# Patient Record
Sex: Female | Born: 1985 | Race: Black or African American | Hispanic: No | Marital: Single | Smoking: Former smoker
Health system: Southern US, Community
[De-identification: ages and names within clinical notes are randomized; demographics above are authoritative.]

## PROBLEM LIST (undated history)

## (undated) DIAGNOSIS — L0291 Cutaneous abscess, unspecified: Secondary | ICD-10-CM

## (undated) DIAGNOSIS — J45909 Unspecified asthma, uncomplicated: Secondary | ICD-10-CM

## (undated) DIAGNOSIS — J40 Bronchitis, not specified as acute or chronic: Secondary | ICD-10-CM

---
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
## 2007-01-22 ENCOUNTER — Emergency Department (HOSPITAL_COMMUNITY): Admission: EM | Admit: 2007-01-22 | Discharge: 2007-01-22 | Payer: Self-pay | Admitting: Emergency Medicine

## 2008-10-27 ENCOUNTER — Emergency Department (HOSPITAL_COMMUNITY): Admission: EM | Admit: 2008-10-27 | Discharge: 2008-10-27 | Payer: Self-pay | Admitting: Emergency Medicine

## 2009-03-09 ENCOUNTER — Emergency Department (HOSPITAL_COMMUNITY): Admission: EM | Admit: 2009-03-09 | Discharge: 2009-03-10 | Payer: Self-pay | Admitting: Emergency Medicine

## 2009-11-26 ENCOUNTER — Emergency Department (HOSPITAL_COMMUNITY): Admission: EM | Admit: 2009-11-26 | Discharge: 2009-11-27 | Payer: Self-pay | Admitting: Emergency Medicine

## 2009-12-23 ENCOUNTER — Emergency Department (HOSPITAL_COMMUNITY): Admission: EM | Admit: 2009-12-23 | Discharge: 2009-12-23 | Payer: Self-pay | Admitting: Emergency Medicine

## 2010-01-20 ENCOUNTER — Emergency Department (HOSPITAL_COMMUNITY): Admission: EM | Admit: 2010-01-20 | Discharge: 2010-01-20 | Payer: Self-pay | Admitting: Emergency Medicine

## 2010-03-04 ENCOUNTER — Emergency Department (HOSPITAL_COMMUNITY): Admission: EM | Admit: 2010-03-04 | Discharge: 2009-08-30 | Payer: Self-pay | Admitting: Emergency Medicine

## 2010-03-11 ENCOUNTER — Emergency Department (HOSPITAL_COMMUNITY)
Admission: EM | Admit: 2010-03-11 | Discharge: 2010-03-11 | Payer: Self-pay | Source: Home / Self Care | Admitting: Emergency Medicine

## 2010-03-28 ENCOUNTER — Emergency Department (HOSPITAL_COMMUNITY)
Admission: EM | Admit: 2010-03-28 | Discharge: 2010-03-28 | Payer: Self-pay | Source: Home / Self Care | Admitting: Emergency Medicine

## 2010-03-31 ENCOUNTER — Emergency Department (HOSPITAL_COMMUNITY)
Admission: EM | Admit: 2010-03-31 | Discharge: 2010-04-01 | Payer: Self-pay | Source: Home / Self Care | Admitting: Emergency Medicine

## 2010-04-03 ENCOUNTER — Emergency Department (HOSPITAL_COMMUNITY)
Admission: EM | Admit: 2010-04-03 | Discharge: 2010-04-03 | Payer: Self-pay | Source: Home / Self Care | Admitting: Emergency Medicine

## 2010-05-01 ENCOUNTER — Emergency Department (HOSPITAL_COMMUNITY): Payer: Self-pay

## 2010-05-01 ENCOUNTER — Emergency Department (HOSPITAL_COMMUNITY)
Admission: EM | Admit: 2010-05-01 | Discharge: 2010-05-01 | Disposition: A | Discharge status: Home / Self Care | Payer: Self-pay | Attending: Emergency Medicine | Admitting: Emergency Medicine

## 2010-05-01 DIAGNOSIS — R51 Headache: Secondary | ICD-10-CM | POA: Insufficient documentation

## 2010-05-01 DIAGNOSIS — S0990XA Unspecified injury of head, initial encounter: Secondary | ICD-10-CM | POA: Insufficient documentation

## 2010-05-01 DIAGNOSIS — S0100XA Unspecified open wound of scalp, initial encounter: Secondary | ICD-10-CM | POA: Insufficient documentation

## 2010-06-07 LAB — URINALYSIS, ROUTINE W REFLEX MICROSCOPIC
Ketones, ur: NEGATIVE mg/dL
Nitrite: NEGATIVE

## 2010-06-07 LAB — URINE MICROSCOPIC-ADD ON

## 2010-06-29 LAB — CBC
HCT: 36.8 % (ref 36.0–46.0)
Hemoglobin: 12.5 g/dL (ref 12.0–15.0)
MCHC: 33.9 g/dL (ref 30.0–36.0)
MCV: 91.5 fL (ref 78.0–100.0)
Platelets: 283 K/uL (ref 150–400)
RBC: 4.03 MIL/uL (ref 3.87–5.11)
RDW: 14.1 % (ref 11.5–15.5)
WBC: 11 K/uL — ABNORMAL HIGH (ref 4.0–10.5)

## 2010-06-29 LAB — URINALYSIS, ROUTINE W REFLEX MICROSCOPIC
Bilirubin Urine: NEGATIVE
Glucose, UA: NEGATIVE mg/dL
Ketones, ur: NEGATIVE mg/dL
Nitrite: NEGATIVE
Protein, ur: NEGATIVE mg/dL
Specific Gravity, Urine: 1.007 (ref 1.005–1.030)
Urobilinogen, UA: 1 mg/dL (ref 0.0–1.0)
pH: 6.5 (ref 5.0–8.0)

## 2010-06-29 LAB — DIFFERENTIAL
Basophils Absolute: 0 K/uL (ref 0.0–0.1)
Basophils Relative: 0 % (ref 0–1)
Eosinophils Absolute: 0 K/uL (ref 0.0–0.7)
Eosinophils Relative: 0 % (ref 0–5)
Lymphocytes Relative: 13 % (ref 12–46)
Lymphs Abs: 1.4 K/uL (ref 0.7–4.0)
Monocytes Absolute: 0.6 K/uL (ref 0.1–1.0)
Monocytes Relative: 5 % (ref 3–12)
Neutro Abs: 8.9 K/uL — ABNORMAL HIGH (ref 1.7–7.7)
Neutrophils Relative %: 81 % — ABNORMAL HIGH (ref 43–77)

## 2010-06-29 LAB — URINE MICROSCOPIC-ADD ON

## 2010-06-29 LAB — POCT PREGNANCY, URINE: Preg Test, Ur: NEGATIVE

## 2010-06-29 LAB — BASIC METABOLIC PANEL WITH GFR
BUN: 4 mg/dL — ABNORMAL LOW (ref 6–23)
CO2: 24 meq/L (ref 19–32)
Calcium: 9 mg/dL (ref 8.4–10.5)
Chloride: 101 meq/L (ref 96–112)
Creatinine, Ser: 0.74 mg/dL (ref 0.4–1.2)
GFR calc non Af Amer: >60 mL/min
Glucose, Bld: 95 mg/dL (ref 70–99)
Potassium: 3.6 meq/L (ref 3.5–5.1)
Sodium: 134 meq/L — ABNORMAL LOW (ref 135–145)

## 2010-06-29 LAB — GC/CHLAMYDIA PROBE AMP, GENITAL
Chlamydia, DNA Probe: NEGATIVE
GC Probe Amp, Genital: POSITIVE — AB

## 2010-06-29 LAB — SYPHILIS: RPR W/REFLEX TO RPR TITER AND TREPONEMAL ANTIBODIES, TRADITIONAL SCREENING AND DIAGNOSIS ALGORITHM: RPR Ser Ql: NONREACTIVE

## 2010-06-29 LAB — WET PREP, GENITAL
Trich, Wet Prep: NONE SEEN
Yeast Wet Prep HPF POC: NONE SEEN

## 2010-07-03 LAB — CBC: WBC: 10.3 10*3/uL (ref 4.0–10.5)

## 2010-07-03 LAB — TYPE AND SCREEN: Antibody Screen: NEGATIVE

## 2010-07-03 LAB — POCT PREGNANCY, URINE: Preg Test, Ur: NEGATIVE

## 2010-07-03 LAB — DIFFERENTIAL
Basophils Absolute: 0 10*3/uL (ref 0.0–0.1)
Basophils Relative: 0 % (ref 0–1)
Lymphocytes Relative: 14 % (ref 12–46)
Lymphs Abs: 1.5 10*3/uL (ref 0.7–4.0)
Monocytes Relative: 7 % (ref 3–12)
Neutro Abs: 8.1 10*3/uL — ABNORMAL HIGH (ref 1.7–7.7)
Neutrophils Relative %: 78 % — ABNORMAL HIGH (ref 43–77)

## 2010-07-03 LAB — COMPREHENSIVE METABOLIC PANEL
BUN: 5 mg/dL — ABNORMAL LOW (ref 6–23)
GFR calc Af Amer: >60 mL/min (ref >60)
Potassium: 3.9 mEq/L (ref 3.5–5.1)
Sodium: 140 mEq/L (ref 135–145)
Total Bilirubin: 0.7 mg/dL (ref 0.3–1.2)
Total Protein: 7.5 g/dL (ref 6.0–8.3)

## 2010-07-03 LAB — URINALYSIS, ROUTINE W REFLEX MICROSCOPIC
Protein, ur: 30 mg/dL — AB
Specific Gravity, Urine: 1.024 (ref 1.005–1.030)
Urobilinogen, UA: 1 mg/dL (ref 0.0–1.0)

## 2010-07-03 LAB — URINE MICROSCOPIC-ADD ON

## 2010-07-03 LAB — GC/CHLAMYDIA PROBE AMP, GENITAL
Chlamydia, DNA Probe: NEGATIVE
GC Probe Amp, Genital: POSITIVE — AB

## 2010-07-03 LAB — HCG, QUANTITATIVE, PREGNANCY: hCG, Beta Chain, Quant, S: <2 m[IU]/mL (ref <5)

## 2010-11-15 ENCOUNTER — Emergency Department (HOSPITAL_COMMUNITY)
Admission: EM | Admit: 2010-11-15 | Discharge: 2010-11-16 | Disposition: A | Discharge status: Home / Self Care | Payer: Self-pay | Attending: Emergency Medicine | Admitting: Emergency Medicine

## 2010-11-15 DIAGNOSIS — G43909 Migraine, unspecified, not intractable, without status migrainosus: Secondary | ICD-10-CM | POA: Insufficient documentation

## 2010-11-20 ENCOUNTER — Emergency Department (HOSPITAL_COMMUNITY)
Admission: EM | Admit: 2010-11-20 | Discharge: 2010-11-20 | Disposition: A | Discharge status: Home / Self Care | Payer: Self-pay | Attending: Emergency Medicine | Admitting: Emergency Medicine

## 2010-11-20 DIAGNOSIS — K089 Disorder of teeth and supporting structures, unspecified: Secondary | ICD-10-CM | POA: Insufficient documentation

## 2011-05-03 ENCOUNTER — Emergency Department (HOSPITAL_COMMUNITY): Payer: Self-pay

## 2011-05-03 ENCOUNTER — Emergency Department (HOSPITAL_COMMUNITY)
Admission: EM | Admit: 2011-05-03 | Discharge: 2011-05-03 | Disposition: A | Discharge status: Home / Self Care | Payer: Self-pay | Attending: Emergency Medicine | Admitting: Emergency Medicine

## 2011-05-03 ENCOUNTER — Encounter (HOSPITAL_COMMUNITY): Payer: Self-pay | Admitting: Emergency Medicine

## 2011-05-03 DIAGNOSIS — M549 Dorsalgia, unspecified: Secondary | ICD-10-CM | POA: Insufficient documentation

## 2011-05-03 DIAGNOSIS — J40 Bronchitis, not specified as acute or chronic: Secondary | ICD-10-CM | POA: Insufficient documentation

## 2011-05-03 DIAGNOSIS — R079 Chest pain, unspecified: Secondary | ICD-10-CM | POA: Insufficient documentation

## 2011-05-03 MED ORDER — HYDROCOD POLST-CHLORPHEN POLST 10-8 MG/5ML PO LQCR: 5.0000 mL | Freq: Two times a day (BID) | ORAL | Status: DC | PRN

## 2011-05-03 MED ORDER — MORPHINE SULFATE 2 MG/ML IJ SOLN
2.0000 mg | Freq: Once | INTRAMUSCULAR | Status: AC
  Administered 2011-05-03: 2 mg via INTRAMUSCULAR
  Filled 2011-05-03: qty 1

## 2011-05-03 NOTE — ED Notes (Signed)
Patient returned from X-ray 

## 2011-05-03 NOTE — ED Notes (Addendum)
Pt states back pain and "chest pain" (pointing to area under breasts) began yesterday. Pt states pain occurs when taking a breath in, pain is at mid back bra strap level. Pt states she has a productive cough with yellow phlegm. No nausea and vomiting.

## 2011-05-03 NOTE — ED Notes (Signed)
MD at bedside. 

## 2011-05-03 NOTE — ED Notes (Signed)
Pt states she has pain to upper chest. Feels worse when she takes a deep breath.

## 2011-05-03 NOTE — ED Provider Notes (Signed)
History     CSN: [id number]  Arrival date & time 05/03/11  1818   First MD Initiated Contact with Patient 05/03/11 2156      Chief Complaint  Patient presents with  . Back Pain  . Chest Pain    (Consider location/radiation/quality/duration/timing/severity/associated sxs/prior treatment) Patient is a 26 y.o. female presenting with back pain and chest pain. The history is provided by the patient.  Back Pain  Associated symptoms include chest pain. Pertinent negatives include no fever and no headaches.  Chest Pain Primary symptoms include cough. Pertinent negatives for primary symptoms include no fever, no shortness of breath, no palpitations, no nausea and no vomiting.    the patient is a 26 year old, female, with a history of bronchitis, and asthma, who presents to the emergency are meant complaining of right sided chest pain, and back pain.  Dr. cough.  She denies nausea, vomiting, fevers, chills, or shortness of breath.  She denies leg pain or swelling.  She does not take control pills.  She denies smoking.  She took Tylenol for her pain, but it did not work, so she came to the emergency department.    History reviewed. No pertinent past medical history.  History reviewed. No pertinent past surgical history.  Family History  Problem Relation Age of Onset  . Hypertension Mother   . Diabetes Maternal Uncle     History  Substance Use Topics  . Smoking status: Former Games developer  . Smokeless tobacco: Never Used  . Alcohol Use: Yes     occausional    OB History    Grav Para Term Preterm Abortions TAB SAB Ect Mult Living                  Review of Systems  Constitutional: Negative for fever and chills.  HENT: Negative for congestion.   Respiratory: Positive for cough. Negative for shortness of breath.   Cardiovascular: Positive for chest pain. Negative for palpitations and leg swelling.  Gastrointestinal: Negative for nausea and vomiting.  Musculoskeletal: Positive for back  pain.  Neurological: Negative for headaches.  All other systems reviewed and are negative.    Allergies  Review of patient's allergies indicates no known allergies.  Home Medications  No current outpatient prescriptions on file.  BP 118/72  Pulse 89  Temp(Src) 99 F (37.2 C) (Oral)  SpO2 99%  LMP 04/15/2011  Physical Exam  Vitals reviewed. Constitutional: She is oriented to person, place, and time. She appears well-developed and well-nourished.  HENT:  Head: Normocephalic and atraumatic.  Eyes: Pupils are equal, round, and reactive to light.  Neck: Normal range of motion.  Cardiovascular: Normal rate, regular rhythm and normal heart sounds.   No murmur heard. Pulmonary/Chest: Effort normal and breath sounds normal. No respiratory distress. She has no wheezes. She has no rales.  Abdominal: Soft. She exhibits no distension and no mass. There is no tenderness. There is no rebound and no guarding.  Musculoskeletal: Normal range of motion. She exhibits no edema and no tenderness.  Neurological: She is alert and oriented to person, place, and time. No cranial nerve deficit.  Skin: Skin is warm and dry. No rash noted. No erythema.  Psychiatric: She has a normal mood and affect. Her behavior is normal.    ED Course  Procedures (including critical care time)  Labs Reviewed - No data to display No results found.   No diagnosis found.    MDM  Bronchitis No pneumonia no hypoxia or respiratory distress  Nicholes Stairs, MD 05/03/11 [phone]

## 2012-09-22 ENCOUNTER — Emergency Department (HOSPITAL_COMMUNITY): Payer: Self-pay

## 2012-09-22 ENCOUNTER — Encounter (HOSPITAL_COMMUNITY): Payer: Self-pay

## 2012-09-22 ENCOUNTER — Emergency Department (HOSPITAL_COMMUNITY)
Admission: EM | Admit: 2012-09-22 | Discharge: 2012-09-22 | Disposition: A | Discharge status: Home / Self Care | Payer: Self-pay | Attending: Emergency Medicine | Admitting: Emergency Medicine

## 2012-09-22 DIAGNOSIS — Z87891 Personal history of nicotine dependence: Secondary | ICD-10-CM | POA: Insufficient documentation

## 2012-09-22 DIAGNOSIS — M549 Dorsalgia, unspecified: Secondary | ICD-10-CM | POA: Insufficient documentation

## 2012-09-22 DIAGNOSIS — R071 Chest pain on breathing: Secondary | ICD-10-CM | POA: Insufficient documentation

## 2012-09-22 DIAGNOSIS — R0789 Other chest pain: Secondary | ICD-10-CM

## 2012-09-22 LAB — POCT I-STAT, CHEM 8
BUN: 6 mg/dL (ref 6–23)
Calcium, Ion: 1.22 mmol/L (ref 1.12–1.23)
Chloride: 102 mEq/L (ref 96–112)
Creatinine, Ser: 0.8 mg/dL (ref 0.50–1.10)
Glucose, Bld: 95 mg/dL (ref 70–99)
Potassium: 4.1 mEq/L (ref 3.5–5.1)

## 2012-09-22 MED ORDER — OXYCODONE-ACETAMINOPHEN 5-325 MG PO TABS: ORAL_TABLET | ORAL | Status: DC

## 2012-09-22 MED ORDER — MORPHINE SULFATE 4 MG/ML IJ SOLN
4.0000 mg | Freq: Once | INTRAMUSCULAR | Status: AC
  Administered 2012-09-22: 4 mg via INTRAMUSCULAR
  Filled 2012-09-22: qty 1

## 2012-09-22 MED ORDER — CYCLOBENZAPRINE HCL 10 MG PO TABS: 10.0000 mg | ORAL_TABLET | Freq: Two times a day (BID) | ORAL | Status: DC | PRN

## 2012-09-22 NOTE — ED Provider Notes (Signed)
History    CSN: [id number] Arrival date & time 09/22/12  4098  First MD Initiated Contact with Patient 09/22/12 0510     Chief Complaint  Patient presents with  . Chest Pain  . Back Pain   (Consider location/radiation/quality/duration/timing/severity/associated sxs/prior Treatment) HPI  Dana Morris is a 27 y.o. female complaining of 2 days of constant, diffuse generalized chest pain that radiates to the back pain is described as sharp, pressure like and cramping it is rated at 10 out of 10. It is exacerbated by eating. She denies trauma, cough, fever, shortness of breath, nausea vomiting, change in bowel or bladder habits, recent immobilization, leg swelling or pain, and history of early cardiac death. Patient is taken ibuprofen, acetaminophen and Percocet with no relief the  History reviewed. No pertinent past medical history. History reviewed. No pertinent past surgical history. Family History  Problem Relation Age of Onset  . Hypertension Mother   . Diabetes Maternal Uncle    History  Substance Use Topics  . Smoking status: Former Games developer  . Smokeless tobacco: Never Used  . Alcohol Use: Yes     Comment: occasional    OB History   Grav Para Term Preterm Abortions TAB SAB Ect Mult Living                 Review of Systems  Constitutional:       Negative except as described in HPI  HENT:       Negative except as described in HPI  Respiratory:       Negative except as described in HPI  Cardiovascular:       Negative except as described in HPI  Gastrointestinal:       Negative except as described in HPI  Genitourinary:       Negative except as described in HPI  Musculoskeletal:       Negative except as described in HPI  Skin:       Negative except as described in HPI  Neurological:       Negative except as described in HPI  All other systems reviewed and are negative.    Allergies  Review of patient's allergies indicates no known allergies.  Home  Medications   Current Outpatient Rx  Name  Route  Sig  Dispense  Refill  . chlorpheniramine-HYDROcodone (TUSSIONEX PENNKINETIC ER) 10-8 MG/5ML LQCR   Oral   Take 5 mLs by mouth every 12 (twelve) hours as needed.   140 mL   0    BP 113/73  Pulse 80  Temp(Src) 98.4 F (36.9 C) (Oral)  Resp 18  Ht 5\' 7"  (1.702 m)  SpO2 99%  LMP 09/13/2012 Physical Exam  Nursing note and vitals reviewed. Constitutional: She is oriented to person, place, and time. She appears well-developed and well-nourished. No distress.  HENT:  Head: Normocephalic.  Mouth/Throat: Oropharynx is clear and moist.  Eyes: Conjunctivae and EOM are normal. Pupils are equal, round, and reactive to light.  Neck: Normal range of motion.  Cardiovascular: Normal rate, regular rhythm, normal heart sounds and intact distal pulses.   Pulmonary/Chest: Effort normal. No stridor. No respiratory distress. She has no wheezes. She has no rales. She exhibits tenderness.  Diffusely tender to palpation of the anterior chest, no crepitus  Abdominal: Soft. Bowel sounds are normal. She exhibits no distension and no mass. There is no tenderness. There is no rebound.  Musculoskeletal: Normal range of motion.  No calf asymmetry, superficial collaterals, palpable cords, edema,  Homans sign negative bilaterally.    Neurological: She is alert and oriented to person, place, and time.  Psychiatric: She has a normal mood and affect.    ED Course  Procedures (including critical care time) Labs Reviewed - No data to display No results found.   Date: 09/22/2012  Rate: 88  Rhythm: normal sinus rhythm  QRS Axis: normal borderline right axis deviation is seen on prior EKG  Intervals: normal  ST/T Wave abnormalities: normal  Conduction Disutrbances:none  Narrative Interpretation:   Old EKG Reviewed: unchanged    1. Chest wall pain     MDM   Filed Vitals:   09/22/12 0438  BP: 113/73  Pulse: 80  Temp: 98.4 F (36.9 C)  TempSrc:  Oral  Resp: 18  Height: 5\' 7"  (1.702 m)  SpO2: 99%     Dana Morris is a 27 y.o. female of constant chest pain for the last 2 days. Patient is low risk by Wells criteria and PERC negative. EKG is nonischemic, troponin is negative, patient is not anemic. We'll treat her for a costochondritis with high-dose NSAID's.  Medications  morphine 4 MG/ML injection 4 mg (4 mg Intramuscular Given 09/22/12 0547)    Pt is hemodynamically stable, appropriate for, and amenable to discharge at this time. Pt verbalized understanding and agrees with care plan. Outpatient follow-up and specific return precautions discussed.    New Prescriptions   CYCLOBENZAPRINE (FLEXERIL) 10 MG TABLET    Take 1 tablet (10 mg total) by mouth 2 (two) times daily as needed for muscle spasms.     Wynetta Emery, PA-C 09/22/12 [phone]

## 2012-09-22 NOTE — ED Notes (Signed)
Pt presents with generalized chest pain that began 2 days ago. Pt says the pain radiates to the center of her back. Pt says the pain is made worse when she "swallows food and tries to digest it". Pt denies shortness of breath and dizziness.

## 2012-09-22 NOTE — ED Provider Notes (Signed)
Medical screening examination/treatment/procedure(s) were performed by non-physician practitioner and as supervising physician I was immediately available for consultation/collaboration.  Javeah Loeza M Lekeshia Kram, MD 09/22/12 0733 

## 2014-08-02 ENCOUNTER — Emergency Department (INDEPENDENT_AMBULATORY_CARE_PROVIDER_SITE_OTHER)
Admission: EM | Admit: 2014-08-02 | Discharge: 2014-08-02 | Disposition: A | Discharge status: Home / Self Care | Payer: Self-pay | Source: Home / Self Care | Attending: Family Medicine | Admitting: Family Medicine

## 2014-08-02 ENCOUNTER — Encounter (HOSPITAL_COMMUNITY): Payer: Self-pay | Admitting: Emergency Medicine

## 2014-08-02 DIAGNOSIS — K0889 Other specified disorders of teeth and supporting structures: Secondary | ICD-10-CM

## 2014-08-02 DIAGNOSIS — K088 Other specified disorders of teeth and supporting structures: Secondary | ICD-10-CM

## 2014-08-02 MED ORDER — IBUPROFEN 800 MG PO TABS: 800.0000 mg | ORAL_TABLET | Freq: Three times a day (TID) | ORAL | Status: DC

## 2014-08-02 MED ORDER — KETOROLAC TROMETHAMINE 60 MG/2ML IM SOLN
60.0000 mg | Freq: Once | INTRAMUSCULAR | Status: AC
  Administered 2014-08-02: 60 mg via INTRAMUSCULAR

## 2014-08-02 MED ORDER — KETOROLAC TROMETHAMINE 60 MG/2ML IM SOLN
INTRAMUSCULAR | Status: AC
  Filled 2014-08-02: qty 2

## 2014-08-02 NOTE — ED Notes (Signed)
C/o dental pain; right side x 3 days Denies feves, chills Alert, no signs of acute distress.

## 2014-08-02 NOTE — Discharge Instructions (Signed)

## 2014-08-02 NOTE — ED Provider Notes (Signed)
CSN: [id number]     Arrival date & time 08/02/14  1435 History   None    Chief Complaint  Patient presents with  . Dental Pain   (Consider location/radiation/quality/duration/timing/severity/associated sxs/prior Treatment) Patient is a 29 y.o. female presenting with tooth pain. The history is provided by the patient.  Dental Pain Location:  Lower Quality:  Constant and sharp Severity:  Mild Onset quality:  Gradual Duration:  3 days Timing:  Constant Progression:  Worsening Chronicity:  New Relieved by:  Acetaminophen Ineffective treatments:  Acetaminophen Associated symptoms: no drooling, no facial swelling, no fever, no headaches, no neck pain and no oral lesions   Risk factors: lack of dental care and smoking     History reviewed. No pertinent past medical history. History reviewed. No pertinent past surgical history. Family History  Problem Relation Age of Onset  . Hypertension Mother   . Diabetes Maternal Uncle    History  Substance Use Topics  . Smoking status: Former Games developermoker  . Smokeless tobacco: Never Used  . Alcohol Use: Yes     Comment: occasional    OB History    No data available     Review of Systems  Constitutional: Negative for fever.  HENT: Positive for dental problem. Negative for drooling, ear pain, facial swelling, hearing loss, mouth sores, rhinorrhea, sinus pressure, sore throat and trouble swallowing.   Respiratory: Negative for shortness of breath.   Musculoskeletal: Negative for neck pain.  Neurological: Negative for dizziness, weakness, numbness and headaches.    Allergies  Review of patient's allergies indicates no known allergies.  Home Medications   Prior to Admission medications   Medication Sig Start Date End Date Taking? Authorizing Provider  acetaminophen (TYLENOL) 500 MG tablet Take 500 mg by mouth every 6 (six) hours as needed for pain.    Historical Provider, MD  ibuprofen (ADVIL,MOTRIN) 800 MG tablet Take 1 tablet (800 mg  total) by mouth 3 (three) times daily. 08/02/14   Ofilia NeasMichael L Clark, PA-C   BP 117/74 mmHg  Pulse 103  Temp(Src) 98 F (36.7 C) (Oral)  Resp 16  SpO2 97%  LMP 07/28/2014 Physical Exam  Constitutional: She is oriented to person, place, and time. She appears well-developed and well-nourished.  HENT:  Mouth/Throat: Uvula is midline, oropharynx is clear and moist and mucous membranes are normal. No oral lesions. No trismus in the jaw. Dental caries present. No dental abscesses, uvula swelling or lacerations.    Cardiovascular: Normal rate.   Checked by me at 90.  Pulmonary/Chest: Effort normal and breath sounds normal.  Neurological: She is alert and oriented to person, place, and time.  Skin: Skin is warm and dry.  Psychiatric: She has a normal mood and affect.  Nursing note and vitals reviewed.   ED Course  Procedures (including critical care time) Labs Review Labs Reviewed - No data to display  Imaging Review No results found.   MDM   1. Tooth pain    Patient with tooth pain on the left most posterior bottom molar with visible damage to the tooth.  The remainder of her dental exam is reassuring.  Will treat with toradol 60 im now.  Advised 800 Ibuprofen in 8 hours from now, and q8 thereafter.  Advised via AVS that she see a dentist as soon as possible.     Ofilia NeasMichael L Clark, PA-C 08/02/14 1557

## 2015-12-19 DIAGNOSIS — Y939 Activity, unspecified: Secondary | ICD-10-CM | POA: Insufficient documentation

## 2015-12-19 DIAGNOSIS — Z87891 Personal history of nicotine dependence: Secondary | ICD-10-CM | POA: Insufficient documentation

## 2015-12-19 DIAGNOSIS — Y92512 Supermarket, store or market as the place of occurrence of the external cause: Secondary | ICD-10-CM | POA: Insufficient documentation

## 2015-12-19 DIAGNOSIS — X501XXA Overexertion from prolonged static or awkward postures, initial encounter: Secondary | ICD-10-CM | POA: Insufficient documentation

## 2015-12-19 DIAGNOSIS — Y999 Unspecified external cause status: Secondary | ICD-10-CM | POA: Insufficient documentation

## 2015-12-19 DIAGNOSIS — S8265XA Nondisplaced fracture of lateral malleolus of left fibula, initial encounter for closed fracture: Secondary | ICD-10-CM | POA: Insufficient documentation

## 2015-12-19 NOTE — ED Triage Notes (Signed)
Pt reports she twisted the left ankle today. C/o pain and swelling in the ankle. Last took tylenol without relief at 1700.

## 2015-12-20 ENCOUNTER — Emergency Department (HOSPITAL_COMMUNITY): Payer: Self-pay

## 2015-12-20 ENCOUNTER — Emergency Department (HOSPITAL_COMMUNITY)
Admission: EM | Admit: 2015-12-20 | Discharge: 2015-12-20 | Disposition: A | Discharge status: Home / Self Care | Payer: Self-pay | Attending: Emergency Medicine | Admitting: Emergency Medicine

## 2015-12-20 ENCOUNTER — Encounter (HOSPITAL_COMMUNITY): Payer: Self-pay | Admitting: *Deleted

## 2015-12-20 DIAGNOSIS — S82892A Other fracture of left lower leg, initial encounter for closed fracture: Secondary | ICD-10-CM

## 2015-12-20 MED ORDER — IBUPROFEN 400 MG PO TABS
800.0000 mg | ORAL_TABLET | Freq: Once | ORAL | Status: AC
  Administered 2015-12-20: 800 mg via ORAL
  Filled 2015-12-20: qty 2

## 2015-12-20 MED ORDER — HYDROCODONE-ACETAMINOPHEN 5-325 MG PO TABS: 1.0000 | ORAL_TABLET | Freq: Four times a day (QID) | ORAL | 0 refills | Status: DC | PRN

## 2015-12-20 NOTE — ED Provider Notes (Signed)
[facility]-EMERGENCY DEPT Provider Note   CSN: [id number] Arrival date & time: 12/19/15  2337     History   Chief Complaint Chief Complaint  Patient presents with  . Ankle Pain    HPI Dana Morris is a 30 y.o. female.  Patient presents to the emergency department with chief complaint of left ankle pain. She states that she rolled her left ankle of the grocery store today. She has tried taking Tylenol with no relief. She reports moderate associated swelling. The pain is worsened with palpation and movement. It is also worsened with ambulation. There are no other associated symptoms. Pain does not radiate. The pain is constant.   The history is provided by the patient. No language interpreter was used.    History reviewed. No pertinent past medical history.  There are no active problems to display for this patient.   History reviewed. No pertinent surgical history.  OB History    No data available       Home Medications    Prior to Admission medications   Medication Sig Start Date End Date Taking? Authorizing Provider  acetaminophen (TYLENOL) 500 MG tablet Take 500 mg by mouth every 6 (six) hours as needed for pain.    Historical Provider, MD  ibuprofen (ADVIL,MOTRIN) 800 MG tablet Take 1 tablet (800 mg total) by mouth 3 (three) times daily. 08/02/14   Ofilia NeasMichael L Clark, PA-C    Family History Family History  Problem Relation Age of Onset  . Hypertension Mother   . Diabetes Maternal Uncle     Social History Social History  Substance Use Topics  . Smoking status: Former Games developermoker  . Smokeless tobacco: Never Used  . Alcohol use Yes     Comment: occasional      Allergies   Review of patient's allergies indicates no known allergies.   Review of Systems Review of Systems  Constitutional: Negative for chills and fever.  Respiratory: Negative for shortness of breath.   Cardiovascular: Negative for chest pain.  Gastrointestinal: Negative for constipation,  diarrhea, nausea and vomiting.  Genitourinary: Negative for dysuria.  Musculoskeletal: Positive for arthralgias, joint swelling and myalgias.     Physical Exam Updated Vital Signs BP 117/84   Pulse 93   Temp 98.4 F (36.9 C) (Oral)   Resp 16   Ht 5\' 7"  (1.702 m)   Wt 47.2 kg   LMP 12/06/2015 (Approximate)   SpO2 98%   BMI 16.29 kg/m   Physical Exam Physical Exam  Constitutional: Pt appears well-developed and well-nourished. No distress.  HENT:  Head: Normocephalic and atraumatic.  Eyes: Conjunctivae are normal.  Neck: Normal range of motion.  Cardiovascular: Normal rate, regular rhythm and intact distal pulses.   Capillary refill < 3 sec  Pulmonary/Chest: Effort normal and breath sounds normal.  Musculoskeletal: Left Ankle: Pt exhibits tenderness to palpation of the left ankle over the ATFL. Pt exhibits no edema.  ROM: 4/5 limited by pain  Neurological: Pt  is alert. Coordination normal.  Sensation 5/5 Strength 4/5 limited by pain  Skin: Skin is warm and dry. Pt is not diaphoretic.  No tenting of the skin  Psychiatric: Pt has a normal mood and affect.  Nursing note and vitals reviewed.   ED Treatments / Results  Labs (all labs ordered are listed, but only abnormal results are displayed) Labs Reviewed - No data to display  EKG  EKG Interpretation None       Radiology Dg Ankle Complete Left  Result Date:  12/20/2015 CLINICAL DATA:  Twisted ankle yesterday while exiting store. EXAM: LEFT ANKLE COMPLETE - 3+ VIEW COMPARISON:  None. FINDINGS: Tiny lateral malleolus tip avulsion fracture in alignment. Ankle mortise appears congruent and lateral clear space intact. No destructive bony lesions. Antral lateral ankle soft tissue swelling without subcutaneous gas or radiopaque foreign bodies. IMPRESSION: Nondisplaced acute lateral malleolus avulsion fracture. No dislocation. Electronically Signed   By: Awilda Metro M.D.   On: 12/20/2015 01:35     Procedures Procedures (including critical care time)  Medications Ordered in ED Medications  ibuprofen (ADVIL,MOTRIN) tablet 800 mg (800 mg Oral Given 12/20/15 0041)     Initial Impression / Assessment and Plan / ED Course  I have reviewed the triage vital signs and the nursing notes.  Pertinent labs & imaging results that were available during my care of the patient were reviewed by me and considered in my medical decision making (see chart for details).  Clinical Course    X-ray shows mild nondisplaced avulsion fracture.  Brace and crutches.   Pt advised to follow up with orthopedics. Patient given brace while in ED, conservative therapy recommended and discussed. Patient will be discharged home & is agreeable with above plan. Returns precautions discussed. Pt appears safe for discharge.   Final Clinical Impressions(s) / ED Diagnoses   Final diagnoses:  Avulsion fracture of ankle, left, closed, initial encounter    New Prescriptions New Prescriptions   HYDROCODONE-ACETAMINOPHEN (NORCO/VICODIN) 5-325 MG TABLET    Take 1-2 tablets by mouth every 6 (six) hours as needed.     Roxy Horseman, PA-C 12/20/15 0145    Pricilla Loveless, MD 12/28/15 0900

## 2015-12-20 NOTE — ED Notes (Signed)
Pt transported to xray 

## 2015-12-20 NOTE — ED Notes (Signed)
PA-C at bedside 

## 2015-12-20 NOTE — ED Notes (Signed)
Patient verbalized understanding of discharge instructions and denies any further needs or questions at this time. VS stable. Patient ambulatory with steady gait. RN escorted to ED entrance.   

## 2016-07-22 ENCOUNTER — Emergency Department (HOSPITAL_COMMUNITY)
Admission: EM | Admit: 2016-07-22 | Discharge: 2016-07-22 | Disposition: A | Discharge status: Home / Self Care | Payer: Self-pay | Attending: Emergency Medicine | Admitting: Emergency Medicine

## 2016-07-22 ENCOUNTER — Encounter (HOSPITAL_COMMUNITY): Payer: Self-pay | Admitting: Emergency Medicine

## 2016-07-22 DIAGNOSIS — Z87891 Personal history of nicotine dependence: Secondary | ICD-10-CM | POA: Insufficient documentation

## 2016-07-22 DIAGNOSIS — J40 Bronchitis, not specified as acute or chronic: Secondary | ICD-10-CM | POA: Insufficient documentation

## 2016-07-22 HISTORY — DX: Unspecified asthma, uncomplicated: J45.909

## 2016-07-22 MED ORDER — PREDNISONE 50 MG PO TABS: ORAL_TABLET | ORAL | 0 refills | Status: DC

## 2016-07-22 MED ORDER — IPRATROPIUM-ALBUTEROL 0.5-2.5 (3) MG/3ML IN SOLN
3.0000 mL | Freq: Once | RESPIRATORY_TRACT | Status: AC
  Administered 2016-07-22: 3 mL via RESPIRATORY_TRACT
  Filled 2016-07-22: qty 3

## 2016-07-22 MED ORDER — BENZONATATE 200 MG PO CAPS: 200.0000 mg | ORAL_CAPSULE | Freq: Three times a day (TID) | ORAL | 0 refills | Status: DC | PRN

## 2016-07-22 MED ORDER — ALBUTEROL SULFATE HFA 108 (90 BASE) MCG/ACT IN AERS
2.0000 | INHALATION_SPRAY | RESPIRATORY_TRACT | Status: DC | PRN
  Filled 2016-07-22: qty 6.7

## 2016-07-22 NOTE — ED Triage Notes (Signed)
Pt reports productive cough since yesterday, hx of recurrent bronchitis, denies fevers. resp e/u, nad.

## 2016-07-22 NOTE — ED Provider Notes (Signed)
[facility]-EMERGENCY DEPT Provider Note   CSN: [id number] Arrival date & time: 07/22/16  1537  By signing my name below, I, Doreatha Martin, attest that this documentation has been prepared under the direction and in the presence of  [facility] M. Damian Leavell, NP. Electronically Signed: Doreatha Martin, ED Scribe. 07/22/16. 4:07 PM.    History   Chief Complaint Chief Complaint  Patient presents with  . Cough    HPI Dana Morris is a 31 y.o. female who presents to the Emergency Department complaining of worsening productive cough with clear phlegm x 2 days with associated wheezing, chest tightness, nasal congestion. She reports h/o asthma and bronchitis, for which she uses an inhaler, but states she recently ran out. No worsening or alleviating factors noted. She reports recent sick contact with children who have a cough. Pt is a non-smoker. She denies nausea, vomiting, diarrhea, abdominal pain, frequency, dysuria, HA, sore throat, ear pain, sinus pain or tenderness, fever.    The history is provided by the patient. No language interpreter was used.  Cough  This is a new problem. The current episode started 2 days ago. The problem occurs every few minutes. The problem has been gradually worsening. The cough is productive of sputum. There has been no fever. Associated symptoms include wheezing. Pertinent negatives include no ear pain, no headaches and no sore throat. She has tried nothing for the symptoms. The treatment provided no relief. She is not a smoker. Her past medical history is significant for bronchitis and asthma.    Past Medical History:  Diagnosis Date  . Asthma     There are no active problems to display for this patient.   History reviewed. No pertinent surgical history.  OB History    No data available       Home Medications    Prior to Admission medications   Medication Sig Start Date End Date Taking? Authorizing Provider  acetaminophen (TYLENOL) 500 MG tablet Take 500 mg by  mouth every 6 (six) hours as needed for pain.    Historical Provider, MD  benzonatate (TESSALON) 200 MG capsule Take 1 capsule (200 mg total) by mouth 3 (three) times daily as needed for cough. 07/22/16   Hope Orlene Och, NP  HYDROcodone-acetaminophen (NORCO/VICODIN) 5-325 MG tablet Take 1-2 tablets by mouth every 6 (six) hours as needed. 12/20/15   Roxy Horseman, PA-C  ibuprofen (ADVIL,MOTRIN) 800 MG tablet Take 1 tablet (800 mg total) by mouth 3 (three) times daily. 08/02/14   Ofilia Neas, PA-C  predniSONE (DELTASONE) 50 MG tablet Take one tablet PO daily 07/22/16   Janne Napoleon, NP    Family History Family History  Problem Relation Age of Onset  . Hypertension Mother   . Diabetes Maternal Uncle     Social History Social History  Substance Use Topics  . Smoking status: Former Games developer  . Smokeless tobacco: Never Used  . Alcohol use Yes     Comment: occasional      Allergies   Patient has no known allergies.   Review of Systems Review of Systems  Constitutional: Negative for fever.  HENT: Positive for congestion. Negative for ear pain, sinus pain, sinus pressure and sore throat.   Respiratory: Positive for cough, chest tightness and wheezing.   Gastrointestinal: Negative for abdominal pain, diarrhea, nausea and vomiting.  Genitourinary: Negative for dysuria.  Neurological: Negative for headaches.     Physical Exam Updated Vital Signs BP 118/84   Pulse 67   Temp 98.8  F (37.1 C) (Oral)   Resp 16   Ht  (1.702 m)   Wt 88 kg   LMP 07/02/2016 (Exact Date)   SpO2 97%   BMI 30.38 kg/m   Physical Exam  Constitutional: She appears well-developed and well-nourished.  HENT:  Head: Normocephalic and atraumatic.  Right Ear: Tympanic membrane normal.  Left Ear: Tympanic membrane normal.  Nose: Right sinus exhibits no maxillary sinus tenderness and no frontal sinus tenderness. Left sinus exhibits no maxillary sinus tenderness and no frontal sinus tenderness.    Mouth/Throat: Uvula is midline, oropharynx is clear and moist and mucous membranes are normal. No posterior oropharyngeal edema or posterior oropharyngeal erythema.  Eyes: EOM are normal. Pupils are equal, round, and reactive to light. No scleral icterus.  Good ocular movement. Sclera clear  Cardiovascular: Normal rate and regular rhythm.   Pulmonary/Chest: Effort normal. No respiratory distress. She has wheezes. She has no rales.  Occasional wheeze on the left.   Abdominal: Soft. There is no tenderness.  No CVA tenderness.   Musculoskeletal: Normal range of motion.  Lymphadenopathy:    She has no cervical adenopathy.  Skin: Skin is warm.  Psychiatric: She has a normal mood and affect. Her behavior is normal.  Nursing note and vitals reviewed.    ED Treatments / Results  Duoneb  DIAGNOSTIC STUDIES: Oxygen Saturation is 97% on RA, normal by my interpretation.    COORDINATION OF CARE: 4:04 PM Discussed treatment plan with pt at bedside which includes breathing tx and pt agreed to plan.    Procedures Procedures (including critical care time)  Medications Ordered in ED Medications  albuterol (PROVENTIL HFA;VENTOLIN HFA) 108 (90 Base) MCG/ACT inhaler 2 puff (not administered)  ipratropium-albuterol (DUONEB) 0.5-2.5 (3) MG/3ML nebulizer solution 3 mL (3 mLs Nebulization Given 07/22/16 1614)     Initial Impression / Assessment and Plan / ED Course  I have reviewed the triage vital signs and the nursing notes.   Clinical Course as of Jul 22 1736  Fri Jul 22, 2016  1631 Pt reevaluated. Lungs are clear and pt reports feeling much better. Plan to send home with an inhaler, Tessalon perles and a short course of steroids.   [EM]    Clinical Course User Index [EM] Doreatha Martin    Garen Grams presents to the ED for evaluation of cough x 2 days. Symptoms consistent with bronchitis. Patient will be sent home with inhaler, Tessalon perles and a short course of steroids.  Conservative therapies discussed and recommended. Patient advised to follow up with PCP as needed, or with worsening symptoms. Patient appears stable for discharge at this time. Return precautions discussed and outlined in discharge paperwork. Patient is agreeable to plan.     Final Clinical Impressions(s) / ED Diagnoses   Final diagnoses:  Bronchitis    New Prescriptions Discharge Medication List as of 07/22/2016  4:33 PM    START taking these medications   Details  benzonatate (TESSALON) 200 MG capsule Take 1 capsule (200 mg total) by mouth 3 (three) times daily as needed for cough., Starting Fri 07/22/2016, Print    predniSONE (DELTASONE) 50 MG tablet Take one tablet PO daily, Print        I personally performed the services described in this documentation, which was scribed in my presence. The recorded information has been reviewed and is accurate.    [place], [place] 07/22/16 1741    Derwood Kaplan, MD 07/23/16 1233

## 2017-01-12 ENCOUNTER — Emergency Department (HOSPITAL_COMMUNITY): Payer: Self-pay

## 2017-01-12 ENCOUNTER — Emergency Department (HOSPITAL_COMMUNITY)
Admission: EM | Admit: 2017-01-12 | Discharge: 2017-01-12 | Disposition: A | Discharge status: Home / Self Care | Payer: Self-pay | Attending: Emergency Medicine | Admitting: Emergency Medicine

## 2017-01-12 DIAGNOSIS — Z79899 Other long term (current) drug therapy: Secondary | ICD-10-CM | POA: Insufficient documentation

## 2017-01-12 DIAGNOSIS — J45909 Unspecified asthma, uncomplicated: Secondary | ICD-10-CM | POA: Insufficient documentation

## 2017-01-12 DIAGNOSIS — Z87891 Personal history of nicotine dependence: Secondary | ICD-10-CM | POA: Insufficient documentation

## 2017-01-12 DIAGNOSIS — M940 Chondrocostal junction syndrome [Tietze]: Secondary | ICD-10-CM | POA: Insufficient documentation

## 2017-01-12 LAB — CBC
HCT: 41.7 % (ref 36.0–46.0)
HEMOGLOBIN: 14.3 g/dL (ref 12.0–15.0)
MCH: 30.7 pg (ref 26.0–34.0)
MCHC: 34.3 g/dL (ref 30.0–36.0)
MCV: 89.5 fL (ref 78.0–100.0)
Platelets: 264 10*3/uL (ref 150–400)
RBC: 4.66 MIL/uL (ref 3.87–5.11)
RDW: 13.4 % (ref 11.5–15.5)
WBC: 6.7 10*3/uL (ref 4.0–10.5)

## 2017-01-12 LAB — BASIC METABOLIC PANEL
Anion gap: 8 (ref 5–15)
BUN: 6 mg/dL (ref 6–20)
CALCIUM: 9.2 mg/dL (ref 8.9–10.3)
CO2: 22 mmol/L (ref 22–32)
CREATININE: 0.78 mg/dL (ref 0.44–1.00)
Chloride: 104 mmol/L (ref 101–111)
GFR calc Af Amer: >60 mL/min (ref >60)
GFR calc non Af Amer: >60 mL/min (ref >60)
GLUCOSE: 89 mg/dL (ref 65–99)
Potassium: 4 mmol/L (ref 3.5–5.1)
Sodium: 134 mmol/L — ABNORMAL LOW (ref 135–145)

## 2017-01-12 LAB — I-STAT TROPONIN, ED: TROPONIN I, POC: 0 ng/mL (ref 0.00–0.08)

## 2017-01-12 MED ORDER — IBUPROFEN 400 MG PO TABS
400.0000 mg | ORAL_TABLET | Freq: Once | ORAL | Status: AC
  Administered 2017-01-12: 400 mg via ORAL
  Filled 2017-01-12: qty 1

## 2017-01-12 NOTE — ED Triage Notes (Signed)
Pt to ed for evaluation of progressive left sided chest pain x2 days that radiates to back with associated symptom of left arm numbness. Denies cough, N/V, lightheadedness, dizziness. Pt endorses mild SOB with worsening pain. NAD at this time. Pt took tylenol/ibuprofen with mild relief of symptoms.

## 2017-01-12 NOTE — ED Provider Notes (Signed)
[facility] Provider Note   CSN: [id number] Arrival date & time: 01/12/17  1535     History   Chief Complaint Chief Complaint  Patient presents with  . Chest Pain     HPI   Blood pressure 125/82, pulse 69, temperature 97.9 F (36.6 C), temperature source Oral, resp. rate 16, height 5\' 7"  (1.702 m), weight 92.1 kg (203 lb), last menstrual period 12/20/2016, SpO2 100 %.  Garen GramsLashina S Zimmermann is a 31 y.o. female complaining of sharp left-sided chest pain exacerbated by movement, palpation and deep breathing onset 2 days ago, not alleviated with Tylenol or aspirin. She denies fevers, chills, cough, shortness of breath, palpitations (she said sometimes she feels like her heart is fluttering), history of DVT/PE, recent immobilization, calf pain, leg swelling, family history of early cardiac death, cocaine or methamphetamine use.  Past Medical History:  Diagnosis Date  . Asthma     There are no active problems to display for this patient.   No past surgical history on file.  OB History    No data available       Home Medications    Prior to Admission medications   Medication Sig Start Date End Date Taking? Authorizing Provider  acetaminophen (TYLENOL) 500 MG tablet Take 500 mg by mouth every 6 (six) hours as needed for pain.   Yes [provider]  ibuprofen (ADVIL,MOTRIN) 800 MG tablet Take 1 tablet (800 mg total) by mouth 3 (three) times daily. Patient taking differently: Take 400 mg by mouth every 6 (six) hours as needed.  08/02/14  Yes Ofilia Neaslark, Michael L, PA-C  benzonatate (TESSALON) 200 MG capsule Take 1 capsule (200 mg total) by mouth 3 (three) times daily as needed for cough. Patient not taking: Reported on 01/12/2017 07/22/16   Janne NapoleonNeese, Hope M, NP  HYDROcodone-acetaminophen (NORCO/VICODIN) 5-325 MG tablet Take 1-2 tablets by mouth every 6 (six) hours as needed. Patient not taking: Reported on 01/12/2017 12/20/15   Roxy HorsemanBrowning,  Robert, PA-C  predniSONE (DELTASONE) 50 MG tablet Take one tablet PO daily Patient not taking: Reported on 01/12/2017 07/22/16   Janne NapoleonNeese, Hope M, NP    Family History Family History  Problem Relation Age of Onset  . Hypertension Mother   . Diabetes Maternal Uncle     Social History Social History  Substance Use Topics  . Smoking status: Former Games developermoker  . Smokeless tobacco: Never Used  . Alcohol use Yes     Comment: occasional      Allergies   Patient has no known allergies.   Review of Systems Review of Systems  A complete review of systems was obtained and all systems are negative except as noted in the HPI and PMH.    Physical Exam Updated Vital Signs BP 125/82 (BP Location: Right Arm)   Pulse 69   Temp 97.9 F (36.6 C) (Oral)   Resp 16   Ht 5\' 7"  (1.702 m)   Wt 92.1 kg (203 lb)   LMP 12/20/2016   SpO2 100%   BMI 31.79 kg/m   Physical Exam  Constitutional: She is oriented to person, place, and time. She appears well-developed and well-nourished. No distress.  HENT:  Head: Normocephalic and atraumatic.  Mouth/Throat: Oropharynx is clear and moist.  Eyes: Pupils are equal, round, and reactive to light. Conjunctivae and EOM are normal.  Neck: Normal range of motion. No JVD present. No tracheal deviation present.  Cardiovascular: Normal rate, regular rhythm and intact distal pulses.  Radial pulse equal bilaterally  Pulmonary/Chest: Effort normal and breath sounds normal. No stridor. No respiratory distress. She has no wheezes. She has no rales. She exhibits tenderness.    Chest pain reproducible to palpation on the left side as diagrammed. No underlying crepitance, lung sounds clear to auscultation bilaterally.  Abdominal: Soft. She exhibits no distension and no mass. There is no tenderness. There is no rebound and no guarding.  Musculoskeletal: Normal range of motion. She exhibits no edema or tenderness.  No calf asymmetry, superficial collaterals, palpable  cords, edema, Homans sign negative bilaterally.    Neurological: She is alert and oriented to person, place, and time.  Skin: Skin is warm. She is not diaphoretic.  Psychiatric: She has a normal mood and affect.  Nursing note and vitals reviewed.    ED Treatments / Results  Labs (all labs ordered are listed, but only abnormal results are displayed) Labs Reviewed  BASIC METABOLIC PANEL - Abnormal; Notable for the following:       Result Value   Sodium 134 (*)    All other components within normal limits  CBC  I-STAT TROPONIN, ED    EKG  EKG Interpretation  Date/Time:  Thursday January 12 2017 15:42:11 EDT Ventricular Rate:  66 PR Interval:  142 QRS Duration: 78 QT Interval:  390 QTC Calculation: 408 R Axis:   96 Text Interpretation:  Normal sinus rhythm with sinus arrhythmia Rightward axis Borderline ECG No acute changes Confirmed by Crista Curb [phone]) on 01/12/2017 6:02:43 PM       Radiology Dg Chest 2 View  Result Date: 01/12/2017 CLINICAL DATA:  Progressive LEFT chest pain radiating to LEFT arm for 2 days. Shortness of breath. History of asthma. EXAM: CHEST  2 VIEW COMPARISON:  Chest radiograph September 22, 2012 FINDINGS: Cardiomediastinal silhouette is normal. No pleural effusions or focal consolidations. Trachea projects midline and there is no pneumothorax. Soft tissue planes and included osseous structures are non-suspicious. IMPRESSION: Stable negative chest. Electronically Signed   By: Awilda Metro M.D.   On: 01/12/2017 18:05    Procedures Procedures (including critical care time)  Medications Ordered in ED Medications  ibuprofen (ADVIL,MOTRIN) tablet 400 mg (not administered)     Initial Impression / Assessment and Plan / ED Course  I have reviewed the triage vital signs and the nursing notes.  Pertinent labs & imaging results that were available during my care of the patient were reviewed by me and considered in my medical decision making (see chart  for details).     Vitals:   01/12/17 1538 01/12/17 1539  BP:  125/82  Pulse:  69  Resp:  16  Temp:  97.9 F (36.6 C)  TempSrc:  Oral  SpO2:  100%  Weight: 92.1 kg (203 lb)   Height: 5\' 7"  (1.702 m)     Medications  ibuprofen (ADVIL,MOTRIN) tablet 400 mg (not administered)    MYKAEL TROTT is 31 y.o. female presenting with Left-sided sharp chest pain onset 2 days ago, reproducible to palpation. EKG with no acute findings, low risk by heart score, troponin negative. Doubt DVT/PE, no tachypnea, tachycardia or hypoxia. Chest x-rays without infiltrate. Considering herreproducible chest wall pain think this is likely a costochondritis. Patient given ibuprofen and resource guide.   Evaluation does not show pathology that would require ongoing emergent intervention or inpatient treatment. Pt is hemodynamically stable and mentating appropriately. Discussed findings and plan with patient/guardian, who agrees with care plan. All questions answered. Return precautions discussed and  outpatient follow up given.     Final Clinical Impressions(s) / ED Diagnoses   Final diagnoses:  Costochondritis    New Prescriptions New Prescriptions   No medications on file     Kaylyn Lim 01/12/17 1831    Lavera Guise, MD 01/14/17 [phone]

## 2017-01-12 NOTE — Discharge Instructions (Signed)
For pain control please take ibuprofen (also known as Motrin or Advil) 800mg (this is normally 4 over the counter pills) 3 times a day  for 5 days. Take with food to minimize stomach irritation. ° °Do not hesitate to return to the emergency room for any new, worsening or concerning symptoms. ° °Please obtain primary care using resource guide below. Let them know that you were seen in the emergency room and that they will need to obtain records for further outpatient management. ° ° ° °

## 2017-07-05 ENCOUNTER — Other Ambulatory Visit: Payer: Self-pay

## 2017-07-05 ENCOUNTER — Ambulatory Visit (HOSPITAL_COMMUNITY)
Admission: EM | Admit: 2017-07-05 | Discharge: 2017-07-05 | Disposition: A | Discharge status: Home / Self Care | Payer: Self-pay | Attending: Urgent Care | Admitting: Urgent Care

## 2017-07-05 ENCOUNTER — Encounter (HOSPITAL_COMMUNITY): Payer: Self-pay | Admitting: Emergency Medicine

## 2017-07-05 DIAGNOSIS — R0602 Shortness of breath: Secondary | ICD-10-CM

## 2017-07-05 DIAGNOSIS — J4530 Mild persistent asthma, uncomplicated: Secondary | ICD-10-CM

## 2017-07-05 DIAGNOSIS — R059 Cough, unspecified: Secondary | ICD-10-CM

## 2017-07-05 DIAGNOSIS — R05 Cough: Secondary | ICD-10-CM

## 2017-07-05 DIAGNOSIS — J4531 Mild persistent asthma with (acute) exacerbation: Secondary | ICD-10-CM

## 2017-07-05 DIAGNOSIS — R0789 Other chest pain: Secondary | ICD-10-CM

## 2017-07-05 DIAGNOSIS — R062 Wheezing: Secondary | ICD-10-CM

## 2017-07-05 DIAGNOSIS — J9801 Acute bronchospasm: Secondary | ICD-10-CM

## 2017-07-05 MED ORDER — CETIRIZINE HCL 10 MG PO TABS: 10.0000 mg | ORAL_TABLET | Freq: Every day | ORAL | 11 refills | Status: DC

## 2017-07-05 MED ORDER — HYDROCOD POLST-CPM POLST ER 10-8 MG/5ML PO SUER: 5.0000 mL | Freq: Every evening | ORAL | 0 refills | Status: DC | PRN

## 2017-07-05 MED ORDER — BENZONATATE 200 MG PO CAPS: 200.0000 mg | ORAL_CAPSULE | Freq: Three times a day (TID) | ORAL | 0 refills | Status: DC | PRN

## 2017-07-05 MED ORDER — ALBUTEROL SULFATE HFA 108 (90 BASE) MCG/ACT IN AERS: 1.0000 | INHALATION_SPRAY | Freq: Four times a day (QID) | RESPIRATORY_TRACT | 0 refills | Status: DC | PRN

## 2017-07-05 MED ORDER — PREDNISONE 20 MG PO TABS: ORAL_TABLET | ORAL | 0 refills | Status: DC

## 2017-07-05 MED ORDER — MONTELUKAST SODIUM 10 MG PO TABS: 10.0000 mg | ORAL_TABLET | Freq: Every day | ORAL | 0 refills | Status: DC

## 2017-07-05 NOTE — ED Triage Notes (Signed)
Pt reports SOB, chest tightness, and productive cough for 2-3 days.  Pt has a hx of bronchitis.  She has an inhaler but has not used it and has taken Myanmaressalon today.

## 2017-07-05 NOTE — ED Provider Notes (Signed)
  MRN: [id number] DOB: 09-27-1985  Subjective:   Dana Morris is a 32 y.o. female presenting for 3 day history of productive cough that elicits head pain, chest pain, shob, wheezing. Denies fever, hemoptysis, sore throat, sinus pain, sinus congestion. Has a history of asthma, has only used her albuterol inhaler today. Also used Tessalon capsules today. Admits allergies, does not take anything for this.   No current facility-administered medications for this encounter.   Current Outpatient Medications:  .  benzonatate (TESSALON) 200 MG capsule, Take 1 capsule (200 mg total) by mouth 3 (three) times daily as needed for cough., Disp: 20 capsule, Rfl: 0 .  acetaminophen (TYLENOL) 500 MG tablet, Take 500 mg by mouth every 6 (six) hours as needed for pain., Disp: , Rfl:    No Known Allergies  Past Medical History:  Diagnosis Date  . Asthma     Denies past surgical history.   Objective:   Vitals: BP 118/84 (BP Location: Right Arm)   Pulse 84   Temp 98.3 F (36.8 C) (Oral)   LMP 06/29/2017 (Approximate)   SpO2 100%   Physical Exam  Constitutional: She is oriented to person, place, and time. She appears well-developed and well-nourished.  HENT:  Mouth/Throat: Oropharynx is clear and moist.  Eyes: Right eye exhibits no discharge. Left eye exhibits no discharge. No scleral icterus.  Cardiovascular: Normal rate, regular rhythm and intact distal pulses. Exam reveals no gallop and no friction rub.  No murmur heard. Pulmonary/Chest: No respiratory distress. She has no wheezes. She has no rales.  Neurological: She is alert and oriented to person, place, and time.  Skin: Skin is warm and dry.  Psychiatric: She has a normal mood and affect.   Assessment and Plan :   Cough  Bronchospasm  Atypical chest pain  Shortness of breath  Wheezing  Mild persistent asthma without complication  We will manage her asthma with albuterol and short steroid course.  Recommend patient start  Zyrtec and Singulair as well for her allergies. Counseled patient on potential for adverse effects with medications prescribed today, patient verbalized understanding. Return-to-clinic precautions discussed, patient verbalized understanding.    Dana Morris, Dana Morris, [place] 07/05/17 1546

## 2017-07-05 NOTE — Discharge Instructions (Signed)
Hydrate well with at least 2 liters (1 gallon) of water daily.  °

## 2017-12-28 ENCOUNTER — Encounter (HOSPITAL_COMMUNITY): Payer: Self-pay | Admitting: *Deleted

## 2017-12-28 ENCOUNTER — Emergency Department (HOSPITAL_COMMUNITY)
Admission: EM | Admit: 2017-12-28 | Discharge: 2017-12-28 | Disposition: A | Discharge status: Home / Self Care | Payer: Self-pay | Attending: Emergency Medicine | Admitting: Emergency Medicine

## 2017-12-28 DIAGNOSIS — L0291 Cutaneous abscess, unspecified: Secondary | ICD-10-CM

## 2017-12-28 DIAGNOSIS — Z87891 Personal history of nicotine dependence: Secondary | ICD-10-CM | POA: Insufficient documentation

## 2017-12-28 DIAGNOSIS — L02411 Cutaneous abscess of right axilla: Secondary | ICD-10-CM | POA: Insufficient documentation

## 2017-12-28 DIAGNOSIS — L03111 Cellulitis of right axilla: Secondary | ICD-10-CM | POA: Insufficient documentation

## 2017-12-28 MED ORDER — OXYCODONE-ACETAMINOPHEN 5-325 MG PO TABS: 1.0000 | ORAL_TABLET | Freq: Three times a day (TID) | ORAL | 0 refills | Status: DC | PRN

## 2017-12-28 MED ORDER — CLINDAMYCIN HCL 300 MG PO CAPS: 300.0000 mg | ORAL_CAPSULE | Freq: Four times a day (QID) | ORAL | 0 refills | Status: DC

## 2017-12-28 MED ORDER — CLINDAMYCIN HCL 300 MG PO CAPS
300.0000 mg | ORAL_CAPSULE | Freq: Once | ORAL | Status: AC
  Administered 2017-12-28: 300 mg via ORAL
  Filled 2017-12-28: qty 1

## 2017-12-28 MED ORDER — OXYCODONE-ACETAMINOPHEN 5-325 MG PO TABS
1.0000 | ORAL_TABLET | Freq: Once | ORAL | Status: AC
  Administered 2017-12-28: 1 via ORAL
  Filled 2017-12-28: qty 1

## 2017-12-28 MED ORDER — LIDOCAINE-EPINEPHRINE 1 %-1:100000 IJ SOLN
20.0000 mL | Freq: Once | INTRAMUSCULAR | Status: AC
  Administered 2017-12-28: 20 mL via INTRADERMAL
  Filled 2017-12-28 (×2): qty 20

## 2017-12-28 NOTE — ED Notes (Signed)
Bed: WTR5 Expected date:  Expected time:  Means of arrival:  Comments: 

## 2017-12-28 NOTE — ED Triage Notes (Signed)
Pt complains of abscess to right underarm for the past 2 days.

## 2017-12-28 NOTE — ED Provider Notes (Signed)
Emergency Department Provider Note   I have reviewed the triage vital signs and the nursing notes.   HISTORY  Chief Complaint Abscess   HPI Dana Morris is a 32 y.o. female with a past medical history of abscesses who presents with an abscess in her right axilla.  Patient states is been there for a few days progressively worsening it was draining but now is not.  Worsening pain, redness and swelling so comes here for further evaluation.  She had incision and drainages in the past.  Never been diagnosed with hidradenitis. No other associated or modifying symptoms.    Past Medical History:  Diagnosis Date  . Asthma     There are no active problems to display for this patient.   History reviewed. No pertinent surgical history.  Current Outpatient Rx  . Order #: [id number] Class: Historical Med  . Order #: [id number] Class: Normal  . Order #: [id number] Class: Print  . Order #: [id number] Class: Normal  . Order #: [id number] Class: Normal  . Order #: [id number] Class: Print  . Order #: [id number] Class: Normal  . Order #: [id number] Class: Print  . Order #: [id number] Class: Print    Allergies Patient has no known allergies.  Family History  Problem Relation Age of Onset  . Hypertension Mother   . Diabetes Maternal Uncle     Social History Social History   Tobacco Use  . Smoking status: Former Games developer  . Smokeless tobacco: Never Used  Substance Use Topics  . Alcohol use: Not Currently    Comment: occasional   . Drug use: Not Currently    Review of Systems  All other systems negative except as documented in the HPI. All pertinent positives and negatives as reviewed in the HPI. ____________________________________________   PHYSICAL EXAM:  VITAL SIGNS: ED Triage Vitals  Enc Vitals Group     BP 12/28/17 0951 (!) 138/101     Pulse Rate 12/28/17 0951 84     Resp 12/28/17 0951 18     Temp 12/28/17 0951 98 F (36.7 C)     Temp Source 12/28/17 0951 Oral     SpO2  12/28/17 0951 96 %    Constitutional: Alert and oriented. Well appearing and in no acute distress. Eyes: Conjunctivae are normal. PERRL. EOMI. Head: Atraumatic. Nose: No congestion/rhinnorhea. Mouth/Throat: Mucous membranes are moist.  Oropharynx non-erythematous. Neck: No stridor.  No meningeal signs.   Cardiovascular: Normal rate, regular rhythm. Good peripheral circulation. Grossly normal heart sounds.   Respiratory: Normal respiratory effort.  No retractions. Lungs CTAB. Gastrointestinal: Soft and nontender. No distention.  Musculoskeletal: No lower extremity tenderness nor edema. No gross deformities of extremities. Neurologic:  Normal speech and language. No gross focal neurologic deficits are appreciated.  Skin:  Skin is warm, dry and intact. No rash noted.  4 x 2 cm swollen, tender, erythematous and fluctuant area in her right axilla.   ____________________________________________    PROCEDURES  Procedure(s) performed:   Marland KitchenMarland KitchenIncision and Drainage Date/Time: 12/29/2017 4:25 PM Performed by: Marily Memos, MD Authorized by: Marily Memos, MD   Consent:    Consent obtained:  Verbal   Consent given by:  Patient   Risks discussed:  Bleeding, damage to other organs, infection, incomplete drainage and pain   Alternatives discussed:  No treatment and delayed treatment Location:    Type:  Abscess   Size:  4x2   Location:  Upper extremity   Upper extremity location: right axilla. Pre-procedure details:    Skin preparation:  Betadine Anesthesia (see  MAR for exact dosages):    Anesthesia method:  Local infiltration   Local anesthetic:  Lidocaine 1% WITH epi Procedure type:    Complexity:  Simple Procedure details:    Needle aspiration: no     Incision types:  Single straight   Incision depth:  Dermal   Scalpel blade:  11   Wound management:  Probed and deloculated and irrigated with saline   Drainage:  Purulent   Drainage amount:  Moderate   Wound treatment:  Wound left  open   Packing materials:  None Post-procedure details:    Patient tolerance of procedure:  Tolerated well, no immediate complications   ____________________________________________   INITIAL IMPRESSION / ASSESSMENT AND PLAN / ED COURSE  Abscess in the right axilla.  Pain medication, lidocaine and will I&D at bedside.  Will start antibiotics as well.  She probably needs to general surgery this point she is had multiple abscesses in her axilla consistent with likely hidradenitis.    Pertinent labs & imaging results that were available during my care of the patient were reviewed by me and considered in my medical decision making (see chart for details).  ____________________________________________  FINAL CLINICAL IMPRESSION(S) / ED DIAGNOSES  Final diagnoses:  Cellulitis of right axilla  Abscess     MEDICATIONS GIVEN DURING THIS VISIT:  Medications  clindamycin (CLEOCIN) capsule 300 mg (300 mg Oral Given 12/28/17 1101)  oxyCODONE-acetaminophen (PERCOCET/ROXICET) 5-325 MG per tablet 1 tablet (1 tablet Oral Given 12/28/17 1101)  lidocaine-EPINEPHrine (XYLOCAINE W/EPI) 1 %-1:100000 (with pres) injection 20 mL (20 mLs Intradermal Given 12/28/17 1123)     NEW OUTPATIENT MEDICATIONS STARTED DURING THIS VISIT:  Discharge Medication List as of 12/28/2017 11:40 AM    START taking these medications   Details  clindamycin (CLEOCIN) 300 MG capsule Take 1 capsule (300 mg total) by mouth 4 (four) times daily. X 7 days, Starting Thu 12/28/2017, Print    oxyCODONE-acetaminophen (PERCOCET) 5-325 MG tablet Take 1 tablet by mouth every 8 (eight) hours as needed for severe pain., Starting Thu 12/28/2017, Print        Note:  This note was prepared with assistance of Dragon voice recognition software. Occasional wrong-word or sound-a-like substitutions may have occurred due to the inherent limitations of voice recognition software.   Marily Memos, MD 12/29/17 1626

## 2017-12-28 NOTE — Discharge Instructions (Signed)
I have cut open an abscess in your right armpit. Please continue to use warm compresses multiple times a day and massage the area to keep the wound open and draining. Take antibiotics as prescribed. Return here if worsening swelling, redness or severe pain.

## 2018-05-30 ENCOUNTER — Other Ambulatory Visit: Payer: Self-pay

## 2018-05-30 ENCOUNTER — Emergency Department (HOSPITAL_COMMUNITY)
Admission: EM | Admit: 2018-05-30 | Discharge: 2018-05-30 | Disposition: A | Discharge status: Home / Self Care | Payer: Self-pay | Attending: Emergency Medicine | Admitting: Emergency Medicine

## 2018-05-30 ENCOUNTER — Encounter (HOSPITAL_COMMUNITY): Payer: Self-pay | Admitting: Emergency Medicine

## 2018-05-30 DIAGNOSIS — L02411 Cutaneous abscess of right axilla: Secondary | ICD-10-CM | POA: Insufficient documentation

## 2018-05-30 DIAGNOSIS — J45909 Unspecified asthma, uncomplicated: Secondary | ICD-10-CM | POA: Insufficient documentation

## 2018-05-30 DIAGNOSIS — Z79899 Other long term (current) drug therapy: Secondary | ICD-10-CM | POA: Insufficient documentation

## 2018-05-30 DIAGNOSIS — Z87891 Personal history of nicotine dependence: Secondary | ICD-10-CM | POA: Insufficient documentation

## 2018-05-30 MED ORDER — SULFAMETHOXAZOLE-TRIMETHOPRIM 800-160 MG PO TABS: 1.0000 | ORAL_TABLET | Freq: Two times a day (BID) | ORAL | 0 refills | Status: AC

## 2018-05-30 MED ORDER — LIDOCAINE-EPINEPHRINE (PF) 2 %-1:200000 IJ SOLN
10.0000 mL | Freq: Once | INTRAMUSCULAR | Status: AC
  Administered 2018-05-30: 10 mL
  Filled 2018-05-30: qty 20

## 2018-05-30 NOTE — ED Provider Notes (Signed)
[facility]-EMERGENCY DEPT Provider Note   CSN: [id number] Arrival date & time: 05/30/18  1245    History   Chief Complaint Chief Complaint  Patient presents with  . Abscess    HPI Dana Morris is a 33 y.o. female.     The history is provided by the patient. No language interpreter was used.  Abscess  Associated symptoms: no fever    Dana Morris is a 33 y.o. female  with a PMH of asthma who presents to the Emergency Department complaining of progressively worsening abscess to the right axilla for the last four days. Taking tylenol  with little improvement. No fever, chills, nausea, vomiting. Hx of similar in October where she had to have I&D.   Past Medical History:  Diagnosis Date  . Asthma     There are no active problems to display for this patient.   History reviewed. No pertinent surgical history.   OB History   No obstetric history on file.      Home Medications    Prior to Admission medications   Medication Sig Start Date End Date Taking? Authorizing Provider  acetaminophen (TYLENOL) 500 MG tablet Take 500 mg by mouth every 6 (six) hours as needed for pain.    [provider]  albuterol (PROVENTIL HFA;VENTOLIN HFA) 108 (90 Base) MCG/ACT inhaler Inhale 1-2 puffs into the lungs every 6 (six) hours as needed for wheezing or shortness of breath. 07/05/17   Wallis Bamberg, PA-C  benzonatate (TESSALON) 200 MG capsule Take 1 capsule (200 mg total) by mouth 3 (three) times daily as needed for cough. 07/05/17   Wallis Bamberg, PA-C  cetirizine (ZYRTEC ALLERGY) 10 MG tablet Take 1 tablet (10 mg total) by mouth daily. 07/05/17   Wallis Bamberg, PA-C  chlorpheniramine-HYDROcodone (TUSSIONEX PENNKINETIC ER) 10-8 MG/5ML SUER Take 5 mLs by mouth at bedtime as needed for cough. 07/05/17   Wallis Bamberg, PA-C  clindamycin (CLEOCIN) 300 MG capsule Take 1 capsule (300 mg total) by mouth 4 (four) times daily. X 7 days 12/28/17   Mesner, Barbara Cower, MD    montelukast (SINGULAIR) 10 MG tablet Take 1 tablet (10 mg total) by mouth at bedtime. 07/05/17   Wallis Bamberg, PA-C  oxyCODONE-acetaminophen (PERCOCET) 5-325 MG tablet Take 1 tablet by mouth every 8 (eight) hours as needed for severe pain. 12/28/17   Mesner, Barbara Cower, MD  predniSONE (DELTASONE) 20 MG tablet Take 2 tablets daily with breakfast. 07/05/17   Wallis Bamberg, PA-C  sulfamethoxazole-trimethoprim (BACTRIM DS,SEPTRA DS) 800-160 MG tablet Take 1 tablet by mouth 2 (two) times daily for 7 days. 05/30/18 06/06/18  Scotlyn Mccranie, Chase Picket, PA-C    Family History Family History  Problem Relation Age of Onset  . Hypertension Mother   . Diabetes Maternal Uncle     Social History Social History   Tobacco Use  . Smoking status: Former Games developer  . Smokeless tobacco: Never Used  Substance Use Topics  . Alcohol use: Not Currently    Comment: occasional   . Drug use: Not Currently     Allergies   Patient has no known allergies.   Review of Systems Review of Systems  Constitutional: Negative for chills and fever.  Skin: Positive for wound.     Physical Exam Updated Vital Signs BP 133/90 (BP Location: Left Arm)   Pulse 65   Temp 98 F (36.7 C) (Oral)   Resp 14   Ht  (1.702 m)   Wt 93 kg  LMP 05/23/2018   SpO2 100%   BMI 32.11 kg/m   Physical Exam Vitals signs and nursing note reviewed.  Constitutional:      General: She is not in acute distress.    Appearance: She is well-developed.  HENT:     Head: Normocephalic and atraumatic.  Neck:     Musculoskeletal: Neck supple.  Cardiovascular:     Rate and Rhythm: Normal rate and regular rhythm.     Heart sounds: Normal heart sounds. No murmur.  Pulmonary:     Effort: Pulmonary effort is normal. No respiratory distress.     Breath sounds: Normal breath sounds. No wheezing or rales.  Musculoskeletal: Normal range of motion.  Skin:    General: Skin is warm and dry.     Comments: 2x3 tender area of fluctuance under the right  axilla consistent with abscess with overlying erythema.  No active draining.  Neurological:     Mental Status: She is alert.      ED Treatments / Results  Labs (all labs ordered are listed, but only abnormal results are displayed) Labs Reviewed - No data to display  EKG None  Radiology No results found.  Procedures .Marland KitchenIncision and Drainage Date/Time: 05/30/2018 4:12 PM Performed by: Danett Palazzo, Chase Picket, PA-C Authorized by: Estuardo Frisbee, Chase Picket, PA-C   Consent:    Consent obtained:  Verbal   Consent given by:  Patient   Risks discussed:  Bleeding, incomplete drainage, pain and infection Location:    Type:  Abscess Pre-procedure details:    Skin preparation:  Betadine Anesthesia (see MAR for exact dosages):    Anesthesia method:  Local infiltration   Local anesthetic:  Lidocaine 2% WITH epi Procedure details:    Incision types:  Stab incision   Scalpel blade:  11   Wound management:  Probed and deloculated   Drainage:  Purulent   Drainage amount:  Copious   Wound treatment:  Wound left open   Packing materials:  None Post-procedure details:    Patient tolerance of procedure:  Tolerated well, no immediate complications   (including critical care time)  Medications Ordered in ED Medications  lidocaine-EPINEPHrine (XYLOCAINE W/EPI) 2 %-1:200000 (PF) injection 10 mL (10 mLs Infiltration Given 05/30/18 1552)     Initial Impression / Assessment and Plan / ED Course  I have reviewed the triage vital signs and the nursing notes.  Pertinent labs & imaging results that were available during my care of the patient were reviewed by me and considered in my medical decision making (see chart for details).       Dana Morris is a 33 y.o. female who presents to ED for abscess requiring incision and drainage. I&D performed per procedure note above. Patient tolerated the procedure well.  Patient was prescribed Bactrim. Wound care instructions discussed. Wound check in 2-3  days.  Patient reports history of multiple abscesses to this area in the past and was told by her primary doctor that she needed her sweat glands removed.  She never followed up with specialist.  Will refer to general surgery as this is her second axillary abscess drained in the ER last 6 months.  Return to ER if concern for spread of infection, increasing pain, fevers or other concerns. All questions answered.   Final Clinical Impressions(s) / ED Diagnoses   Final diagnoses:  Abscess of axilla, right    ED Discharge Orders         Ordered    sulfamethoxazole-trimethoprim (BACTRIM DS,SEPTRA DS)  800-160 MG tablet  2 times daily     05/30/18 1557           Beyounce Dickens, Chase Picket, PA-C 05/30/18 1613    Linwood Dibbles, MD 05/31/18 [id number]

## 2018-05-30 NOTE — ED Notes (Signed)
Pt refused discharge vital signs

## 2018-05-30 NOTE — Discharge Instructions (Signed)
Please take all of your antibiotics until finished!   You may develop abdominal discomfort or diarrhea from the antibiotic. You may help offset this with probiotics which you can buy or get in yogurt.   Tylenol or Ibuprofen as needed for pain.  Please call the surgery clinic first thing in the morning to schedule a follow up appointment.   Return to the emergency department if you develop a fever, new or worsening symptoms develop, any additional concerns.

## 2018-05-30 NOTE — ED Triage Notes (Signed)
Pt complaint of abscess to right underarm for 4 days; hx of same.

## 2018-08-02 ENCOUNTER — Other Ambulatory Visit: Payer: Self-pay

## 2018-08-02 ENCOUNTER — Ambulatory Visit (HOSPITAL_COMMUNITY)
Admission: EM | Admit: 2018-08-02 | Discharge: 2018-08-02 | Disposition: A | Discharge status: Home / Self Care | Payer: Self-pay | Attending: Family Medicine | Admitting: Family Medicine

## 2018-08-02 ENCOUNTER — Encounter (HOSPITAL_COMMUNITY): Payer: Self-pay | Admitting: Emergency Medicine

## 2018-08-02 DIAGNOSIS — L02411 Cutaneous abscess of right axilla: Secondary | ICD-10-CM

## 2018-08-02 MED ORDER — DOXYCYCLINE HYCLATE 100 MG PO CAPS: 100.0000 mg | ORAL_CAPSULE | Freq: Two times a day (BID) | ORAL | 0 refills | Status: DC

## 2018-08-02 MED ORDER — LIDOCAINE HCL 2 % IJ SOLN
INTRAMUSCULAR | Status: AC
  Filled 2018-08-02: qty 20

## 2018-08-02 MED ORDER — HYDROCODONE-ACETAMINOPHEN 5-325 MG PO TABS: 1.0000 | ORAL_TABLET | ORAL | 0 refills | Status: DC | PRN

## 2018-08-02 NOTE — Discharge Instructions (Addendum)
Please begin doxycycline for 10 days  Apply warm compresses/hot rags to area with massage to express further drainage especially the first 24-48 hours  Tylenol/Ibuprofen for mild-moderate pain Hydrocodone for severe pain- do not drive or work after taking, use sparingly  Return if symptoms returning or not improving

## 2018-08-02 NOTE — ED Triage Notes (Signed)
Abscess to right axilla for 2-3 days.  History of the same

## 2018-08-02 NOTE — ED Provider Notes (Signed)
[facility]    CSN: [id number] Arrival date & time: 08/02/18  1633     History   Chief Complaint Chief Complaint  Patient presents with  . Abscess    HPI Dana Morris is a 33 y.o. female history of asthma presenting today for evaluation of an abscess.  She has had an abscess to her right axilla that has become more painful and swollen over the past 2 to 3 days.  She has had a similar abscess in the same area recurrently.  Last drained 3/05.  She has never seen surgery for this.  She denies any fevers.  Has a lot of pain with moving shoulder.  HPI  Past Medical History:  Diagnosis Date  . Asthma     There are no active problems to display for this patient.   History reviewed. No pertinent surgical history.  OB History   No obstetric history on file.      Home Medications    Prior to Admission medications   Medication Sig Start Date End Date Taking? Authorizing Provider  acetaminophen (TYLENOL) 500 MG tablet Take 500 mg by mouth every 6 (six) hours as needed for pain.    [provider]  doxycycline (VIBRAMYCIN) 100 MG capsule Take 1 capsule (100 mg total) by mouth 2 (two) times daily for 7 days. 08/02/18 08/09/18  Leilana Mcquire C, PA-C  HYDROcodone-acetaminophen (NORCO/VICODIN) 5-325 MG tablet Take 1 tablet by mouth every 4 (four) hours as needed for severe pain. 08/02/18   Linsi Humann, Junius Creamer, PA-C    Family History Family History  Problem Relation Age of Onset  . Hypertension Mother   . Diabetes Maternal Uncle     Social History Social History   Tobacco Use  . Smoking status: Former Games developer  . Smokeless tobacco: Never Used  Substance Use Topics  . Alcohol use: Not Currently    Comment: occasional   . Drug use: Not Currently     Allergies   Patient has no known allergies.   Review of Systems Review of Systems  Constitutional: Negative for fatigue and fever.  Eyes: Negative for visual disturbance.  Respiratory: Negative for  shortness of breath.   Cardiovascular: Negative for chest pain.  Gastrointestinal: Negative for abdominal pain, nausea and vomiting.  Musculoskeletal: Negative for arthralgias and joint swelling.  Skin: Positive for color change. Negative for rash and wound.  Neurological: Negative for dizziness, weakness, light-headedness and headaches.     Physical Exam Triage Vital Signs ED Triage Vitals  Enc Vitals Group     BP 08/02/18 1706 116/85     Pulse Rate 08/02/18 1706 77     Resp 08/02/18 1706 16     Temp 08/02/18 1706 98.5 F (36.9 C)     Temp Source 08/02/18 1706 Oral     SpO2 08/02/18 1706 98 %     Weight --      Height --      Head Circumference --      Peak Flow --      Pain Score 08/02/18 1704 10     Pain Loc --      Pain Edu? --      Excl. in GC? --    No data found.  Updated Vital Signs BP 116/85 (BP Location: Left Arm)   Pulse 77   Temp 98.5 F (36.9 C) (Oral)   Resp 16   LMP 07/14/2018   SpO2 98%   Visual Acuity Right Eye Distance:  Left Eye Distance:   Bilateral Distance:    Right Eye Near:   Left Eye Near:    Bilateral Near:     Physical Exam Vitals signs and nursing note reviewed.  Constitutional:      Appearance: She is well-developed.     Comments: No acute distress  HENT:     Head: Normocephalic and atraumatic.     Nose: Nose normal.  Eyes:     Conjunctiva/sclera: Conjunctivae normal.  Neck:     Musculoskeletal: Neck supple.  Cardiovascular:     Rate and Rhythm: Normal rate.  Pulmonary:     Effort: Pulmonary effort is normal. No respiratory distress.  Abdominal:     General: There is no distension.  Musculoskeletal: Normal range of motion.  Skin:    General: Skin is warm and dry.     Comments: Right axilla with multiple areas of induration, swelling, surrounding erythema, significant tenderness to touch and discomfort with moving right shoulder  No palpable lymphadenopathy  Neurological:     Mental Status: She is alert and  oriented to person, place, and time.      UC Treatments / Results  Labs (all labs ordered are listed, but only abnormal results are displayed) Labs Reviewed - No data to display  EKG None  Radiology No results found.  Procedures Incision and Drainage Date/Time: 08/02/2018 6:15 PM Performed by: Claudius Mich, Junius CreamerHallie C, PA-C Authorized by: Eustace MooreNelson, Yvonne Sue, MD   Consent:    Consent obtained:  Verbal   Consent given by:  Patient   Risks discussed:  Bleeding, incomplete drainage and pain   Alternatives discussed:  Alternative treatment Location:    Type:  Abscess   Size:  4   Location: Right axilla. Pre-procedure details:    Skin preparation:  Betadine Anesthesia (see MAR for exact dosages):    Anesthesia method:  Local infiltration   Local anesthetic:  Lidocaine 2% w/o epi Procedure type:    Complexity:  Simple Procedure details:    Needle aspiration: no     Incision types:  Single straight   Incision depth:  Subcutaneous   Scalpel blade:  11   Wound management:  Probed and deloculated   Drainage:  Bloody   Drainage amount:  Scant   Wound treatment:  Wound left open   Packing materials:  None Post-procedure details:    Patient tolerance of procedure:  Tolerated well, no immediate complications   (including critical care time)  Medications Ordered in UC Medications - No data to display  Initial Impression / Assessment and Plan / UC Course  I have reviewed the triage vital signs and the nursing notes.  Pertinent labs & imaging results that were available during my care of the patient were reviewed by me and considered in my medical decision making (see chart for details).    I&D attempted without significant drainage.  Will have patient initiate on doxycycline, warm compresses with gentle massage to express any further drainage.  Tylenol and ibuprofen for mild to moderate pain.  Did provide patient with 6 tablets of hydrocodone to use for severe pain.  Continue to  monitor,Discussed strict return precautions. Patient verbalized understanding and is agreeable with plan.   Discussed following up with surgery given recurrent nature of abscess to axilla.  Final Clinical Impressions(s) / UC Diagnoses   Final diagnoses:  Abscess of axilla, right     Discharge Instructions     Please begin doxycycline for 10 days  Apply warm compresses/hot rags  to area with massage to express further drainage especially the first 24-48 hours  Tylenol/Ibuprofen for mild-moderate pain Hydrocodone for severe pain- do not drive or work after taking, use sparingly  Return if symptoms returning or not improving   ED Prescriptions    Medication Sig Dispense Auth. Provider   doxycycline (VIBRAMYCIN) 100 MG capsule Take 1 capsule (100 mg total) by mouth 2 (two) times daily for 7 days. 14 capsule Eldoris Beiser C, PA-C   HYDROcodone-acetaminophen (NORCO/VICODIN) 5-325 MG tablet Take 1 tablet by mouth every 4 (four) hours as needed for severe pain. 6 tablet Senaya Dicenso, [place] C, PA-C     Controlled Substance Prescriptions [place] Controlled Substance Registry consulted? Yes, I have consulted the  Controlled Substances Registry for this patient, and feel the risk/benefit ratio today is favorable for proceeding with this prescription for a controlled substance.   Lew Dawes, [place] 08/02/18 1818

## 2018-08-04 ENCOUNTER — Emergency Department (HOSPITAL_COMMUNITY)
Admission: EM | Admit: 2018-08-04 | Discharge: 2018-08-04 | Disposition: A | Discharge status: Home / Self Care | Payer: Self-pay | Attending: Emergency Medicine | Admitting: Emergency Medicine

## 2018-08-04 ENCOUNTER — Other Ambulatory Visit: Payer: Self-pay

## 2018-08-04 ENCOUNTER — Encounter (HOSPITAL_COMMUNITY): Payer: Self-pay | Admitting: *Deleted

## 2018-08-04 DIAGNOSIS — L02411 Cutaneous abscess of right axilla: Secondary | ICD-10-CM | POA: Insufficient documentation

## 2018-08-04 DIAGNOSIS — Z79899 Other long term (current) drug therapy: Secondary | ICD-10-CM | POA: Insufficient documentation

## 2018-08-04 DIAGNOSIS — Z87891 Personal history of nicotine dependence: Secondary | ICD-10-CM | POA: Insufficient documentation

## 2018-08-04 DIAGNOSIS — J45909 Unspecified asthma, uncomplicated: Secondary | ICD-10-CM | POA: Insufficient documentation

## 2018-08-04 HISTORY — DX: Cutaneous abscess, unspecified: L02.91

## 2018-08-04 MED ORDER — LIDOCAINE-EPINEPHRINE (PF) 2 %-1:200000 IJ SOLN
10.0000 mL | Freq: Once | INTRAMUSCULAR | Status: AC
  Administered 2018-08-04: 10 mL
  Filled 2018-08-04: qty 10

## 2018-08-04 MED ORDER — HYDROCODONE-ACETAMINOPHEN 5-325 MG PO TABS
1.0000 | ORAL_TABLET | Freq: Once | ORAL | Status: AC
  Administered 2018-08-04: 1 via ORAL
  Filled 2018-08-04: qty 1

## 2018-08-04 NOTE — ED Provider Notes (Signed)
[facility]-EMERGENCY DEPT Provider Note   CSN: [id number] Arrival date & time: 08/04/18  5284    History   Chief Complaint Chief Complaint  Patient presents with  . Abscess    HPI Dana Morris is a 33 y.o. female with a PMH of Asthma and abscess presenting with a right axilla abscess onset 5 days ago. Patient states she had an incision and drainage 2 days ago and was prescribed antibiotics. Patient reports edema and pain over area. Patient was prescribed pain medicine with partial relief. Patient denies fever, drainage, chills, nausea, vomiting, or abdominal pain. Patient states palpating abscess and moving arm makes pain worse. Patient reports she had had multiple abscesses in the past in this area, but has not followed up with a surgeon.      HPI  Past Medical History:  Diagnosis Date  . Abscess   . Asthma     There are no active problems to display for this patient.   History reviewed. No pertinent surgical history.   OB History   No obstetric history on file.      Home Medications    Prior to Admission medications   Medication Sig Start Date End Date Taking? Authorizing Provider  acetaminophen (TYLENOL) 500 MG tablet Take 500 mg by mouth every 6 (six) hours as needed for pain.   Yes [provider]  doxycycline (VIBRAMYCIN) 100 MG capsule Take 1 capsule (100 mg total) by mouth 2 (two) times daily for 7 days. 08/02/18 08/09/18 Yes Wieters, Hallie C, PA-C  HYDROcodone-acetaminophen (NORCO/VICODIN) 5-325 MG tablet Take 1 tablet by mouth every 4 (four) hours as needed for severe pain. 08/02/18  Yes Wieters, [place] C, PA-C    Family History Family History  Problem Relation Age of Onset  . Hypertension Mother   . Diabetes Maternal Uncle     Social History Social History   Tobacco Use  . Smoking status: Former Games developer  . Smokeless tobacco: Never Used  Substance Use Topics  . Alcohol use: Not Currently    Comment: occasional   .  Drug use: Not Currently     Allergies   Patient has no known allergies.   Review of Systems Review of Systems  Constitutional: Negative for chills, diaphoresis and fever.  Respiratory: Negative for cough and shortness of breath.   Cardiovascular: Negative for chest pain.  Gastrointestinal: Negative for abdominal pain, nausea and vomiting.  Endocrine: Negative for cold intolerance and heat intolerance.  Musculoskeletal: Negative for myalgias.  Skin: Negative for rash.       Pt reports an abscess.  Allergic/Immunologic: Negative for immunocompromised state.  Neurological: Negative for weakness and numbness.  Hematological: Negative for adenopathy.   Physical Exam Updated Vital Signs BP (!) 127/95 (BP Location: Left Arm)   Pulse 89   Temp 98.7 F (37.1 C) (Oral)   Resp 19   Ht  (1.702 m)   Wt 95.3 kg   LMP 07/14/2018   SpO2 98%   BMI 32.89 kg/m   Physical Exam Vitals signs and nursing note reviewed.  Constitutional:      General: She is not in acute distress.    Appearance: She is well-developed. She is not diaphoretic.  HENT:     Head: Normocephalic and atraumatic.  Neck:     Musculoskeletal: Normal range of motion.  Cardiovascular:     Rate and Rhythm: Normal rate and regular rhythm.     Heart sounds: Normal heart sounds. No murmur. No  friction rub. No gallop.   Pulmonary:     Effort: Pulmonary effort is normal. No respiratory distress.     Breath sounds: Normal breath sounds. No wheezing or rales.  Abdominal:     Palpations: Abdomen is soft.     Tenderness: There is no abdominal tenderness.  Musculoskeletal: Normal range of motion.  Skin:    General: Skin is warm.     Findings: Abscess present. No rash.       Neurological:     Mental Status: She is alert.      ED Treatments / Results  Labs (all labs ordered are listed, but only abnormal results are displayed) Labs Reviewed - No data to display  EKG None  Radiology No results found.   Procedures .Marland KitchenIncision and Drainage Date/Time: 08/04/2018 8:26 AM Performed by: Leretha Dykes, PA-C Authorized by: Leretha Dykes, PA-C   Consent:    Consent obtained:  Verbal   Consent given by:  Patient   Risks discussed:  Bleeding, incomplete drainage, pain, damage to other organs and infection   Alternatives discussed:  No treatment Universal protocol:    Procedure explained and questions answered to patient or proxy's satisfaction: yes     Relevant documents present and verified: yes     Test results available and properly labeled: yes     Imaging studies available: yes     Required blood products, implants, devices, and special equipment available: yes     Site/side marked: yes     Immediately prior to procedure a time out was called: yes     Patient identity confirmed:  Verbally with patient Location:    Type:  Abscess   Size:  2cm   Location: Right axilla. Pre-procedure details:    Skin preparation:  Betadine Anesthesia (see MAR for exact dosages):    Anesthesia method:  Local infiltration   Local anesthetic:  Lidocaine 1% WITH epi Procedure type:    Complexity:  Complex Procedure details:    Incision types:  Single straight   Incision depth:  Subcutaneous   Scalpel blade:  11   Wound management:  Probed and deloculated, irrigated with saline and extensive cleaning   Drainage:  Purulent and bloody   Drainage amount:  Moderate   Wound treatment:  Wound left open   Packing materials:  None Post-procedure details:    Patient tolerance of procedure:  Tolerated well, no immediate complications   (including critical care time)  Medications Ordered in ED Medications  lidocaine-EPINEPHrine (XYLOCAINE W/EPI) 2 %-1:200000 (PF) injection 10 mL (has no administration in time range)  HYDROcodone-acetaminophen (NORCO/VICODIN) 5-325 MG per tablet 1 tablet (1 tablet Oral Given 08/04/18 0759)     Initial Impression / Assessment and Plan / ED Course  I have reviewed the  triage vital signs and the nursing notes.  Pertinent labs & imaging results that were available during my care of the patient were reviewed by me and considered in my medical decision making (see chart for details).       Patient presents with an abscess. Patient with skin abscess amenable to incision and drainage.  Abscess was not large enough to warrant packing or drain,  wound recheck in 2 days. Encouraged home warm soaks and flushing.  Mild signs of cellulitis is surrounding skin.  Patient was prescribed antibiotics and pain medicine a few days ago. Advised patient to continue these medications as previously prescribed. Advised patient to follow up with a surgeon due to the frequency of  abscesses. Discussed return precautions with patient. Patient states she understands and agrees with plan. Will d/c to home.    Final Clinical Impressions(s) / ED Diagnoses   Final diagnoses:  Abscess of axilla, right    ED Discharge Orders    None       Leretha DykesHernandez, Kippy Melena P, [place]-C 08/04/18 [id number]    Linwood DibblesKnapp, Jon, MD 08/05/18 1000

## 2018-08-04 NOTE — ED Triage Notes (Signed)
Pt seen at [facility] on the 7th, had a boild lanced under rt arm. Now has increased pain and swelling, started on Doxy and has been taking meds.

## 2018-08-04 NOTE — Discharge Instructions (Addendum)
Today you were seen in the Emergency Department for an Abscess.  1. Medications: continue pain medicine and antibiotics as prescribed, usual home medications 2. Treatment: keep wound clean and dry. Apply warm compresses to arm pit throughout the day. Take medications as prescribed.  3. Follow Up: Follow up with your doctor, urgent care, or ER in 2 days for wound recheck and packing removal.  Please return to the ER for fevers, chills, nausea, vomiting or other signs of infection. Return to emergency department for emergent changing or worsening symptoms.  An abscess (boil or furuncle) is an infected area that contains a collection of pus. Sometimes foreign material, such as a splinter can cause an abscess. The body contains the infection within a tight pocket to try and prevent the infection from spreading to other areas of the body.   The treatment for most abscesses is to cut into (incise) the abscess and drain the infection inside.  In most cases, antibiotics are not needed and the abscess will get better quickly. Your provider has determined that antibiotics ARE necessary for treatment of your abscess.   Please take medications as prescribed.  Your abscess was lanced today, and has a small wick to help keep the skin open to allow further drainage.  Return to your doctor, urgent care, or the ER for recheck in 2 days.  Return sooner for worsening pain, fever, redness or persistent bleeding.  You may shower, but do not scrub the area, allow the water to run over the area.  Keep area covered with bandage after bathing.  Apply warm compresses to the affected area throughout the day. Have her return to emergency department for emergent changing or worsening symptoms.  You may return to the emergency department if you have a fever that persists greater than 101 or your abscess appears to become infected (growing surrounding redness and warmth). Do not operate any heavy machinery while on pain medications.  Do not consume alcohol on these medications either.  HOME CARE INSTRUCTIONS  Only take over-the-counter or prescription medicines for pain, discomfort, or fever as directed by your caregiver.  Take your antibiotics as directed if they were prescribed. Finish them even if you start to feel better.  Keep the skin and clothes clean around your abscess.  If the abscess was drained, you will need to use gauze dressing to collect any draining pus. Dressings will typically need to be changed 3 or more times a day.  The infection may spread by skin contact with others. Avoid skin contact as much as possible.  Practice good hygiene. This includes regular hand washing, cover any draining skin lesions, and do not share personal care items.  If you participate in sports, do not share athletic equipment, towels, whirlpools, or personal care items. Shower after every practice or tournament.  If a draining area cannot be adequately covered:  Do not participate in sports.  Children should not participate in day care until the wound has healed or drainage stops.  If your caregiver has given you a follow-up appointment, it is very important to keep that appointment. Not keeping the appointment could result in a much worse infection, chronic or permanent injury, pain, and disability. If there is any problem keeping the appointment, you must call back to this facility for assistance.   SEEK MEDICAL CARE IF:  You develop increased pain, swelling, redness, drainage, or bleeding in the wound site.  You develop signs of generalized infection including muscle aches, chills, fever, or  a general ill feeling.  You have an oral temperature above 102 F (38.9 C).   MAKE SURE YOU:  Understand these instructions.  Will watch your condition.  Will get help right away if you are not doing well or get worse.    *RESOURCE GUIDE  RESOURCE GUIDE  Dental Problems  Patients with Medicaid: [facility]                                             [facility] [phone]                                                       [phone] Phone:  [phone]      [facility]  If unable to pay or uninsured, contact:  [facility] or [facility] to become qualified for the adult dental clinic.  Chronic Pain Problems Contact [facility]  [phone] Patients need to be referred by their primary care doctor.  Insufficient Money for Medicine Contact [employer]:  call "211" or [facility] [phone].  No Primary Care Doctor Call [facility]  [phone] Other agencies that provide inexpensive medical care    [facility]  [phone]    [facility]  [phone]    [facility]  [phone]    [facility]  [phone]    [facility]  [phone]    [facility]  [phone]  Psychological Services [facility]  [phone] [facility]  [phone] [facility] Mental Health   [phone] (emergency services [phone])  Abuse/Neglect [facility] Child Abuse Hotline 504-622-8243 [facility] Child Abuse Hotline 540-187-8724 (After Hours)  Emergency Shelter [facility] 3527143250  Maternity Homes [facility] [place] [facility] [place] 9184476539 [place] Alert Services [phone]  MRSA Hotline #:   360-606-1363  [facility] Resources  [facility] [place]     [employer]                          [facility] Dept. [address] [place]                        [address]          [address] [place] [address]  Patrecia Pace  [facility] Phone:  [phone]                                     Phone:  [phone]                   Phone:  [phone]  [facility] Phone:  [phone]  [facility] Child Abuse Hotline 347-411-5453(336) [phone](336) [phone] (After Hours)   Community Resources: *IF YOU ARE IN IMMEDIATE DANGER CALL 911!  Abuse/Neglect:  Family Services Crisis Hotline [facility]([place]): 519-108-7166(336) [phone] Center Against Violence [facility]([place]): [phone](336) [phone]  After hours, holidays and weekends: 534-132-5109(336) [phone] National Domestic Violence Hotline: 918-371-3730(403)432-6060  Mental Health: [facility]: [address]: [phone]) [phone]  Health Clinics:  [facility] Center Patrcia Dolly([facility]): [phone]) [phone] Monday - Friday 8 AM - 9 PM, Saturday and Sunday 10 AM - 9 PM  [facility] [place]: [phone]) [phone] Monday - Friday 8 AM - 5 PM  [facility]  [address]: ([phone] Monday- Friday 8:30 AM - 5:30 PM, Sat 9 AM - 1 PM  24 HR [place] [facility] [address]: [phone](336) [phone] [facility]: [phone] [facility] on [address]: [phone]  24 HR [facility]: [address] [phone]  Cultures: If culture results are positive, we will notify you if a change in treatment is necessary.  LABORATORY TESTS: If you had any labs drawn in the ED that have not resulted by the time you are discharged home, we will review these lab results and the treatment given to you.  If there is any further treatment or notification needed, we will contact you by phone, or letter.  "PLEASE ENSURE THAT YOU HAVE GIVEN US YOUR CURRENT WORKING PHONE NUMBER AND YOUR CURRENT ADDRESS, so that we can contact you if needed."  Our doctors and staff appreciate your choosing us for your emergency medical care needs. We are here to serve you.

## 2018-08-05 ENCOUNTER — Other Ambulatory Visit: Payer: Self-pay

## 2018-08-05 ENCOUNTER — Encounter (HOSPITAL_COMMUNITY): Payer: Self-pay | Admitting: *Deleted

## 2018-08-05 ENCOUNTER — Inpatient Hospital Stay (HOSPITAL_COMMUNITY)
Admission: EM | Admit: 2018-08-05 | Discharge: 2018-08-08 | DRG: 581 | Disposition: A | Discharge status: Home / Self Care | Payer: Self-pay | Attending: General Surgery | Admitting: General Surgery

## 2018-08-05 DIAGNOSIS — L03111 Cellulitis of right axilla: Secondary | ICD-10-CM

## 2018-08-05 DIAGNOSIS — Z833 Family history of diabetes mellitus: Secondary | ICD-10-CM

## 2018-08-05 DIAGNOSIS — Z8249 Family history of ischemic heart disease and other diseases of the circulatory system: Secondary | ICD-10-CM

## 2018-08-05 DIAGNOSIS — L02411 Cutaneous abscess of right axilla: Principal | ICD-10-CM | POA: Diagnosis present

## 2018-08-05 DIAGNOSIS — Z87891 Personal history of nicotine dependence: Secondary | ICD-10-CM

## 2018-08-05 DIAGNOSIS — Z1159 Encounter for screening for other viral diseases: Secondary | ICD-10-CM

## 2018-08-05 DIAGNOSIS — L732 Hidradenitis suppurativa: Secondary | ICD-10-CM | POA: Diagnosis present

## 2018-08-05 LAB — BASIC METABOLIC PANEL
Anion gap: 14 (ref 5–15)
BUN: 6 mg/dL (ref 6–20)
CO2: 24 mmol/L (ref 22–32)
Calcium: 9.6 mg/dL (ref 8.9–10.3)
Chloride: 100 mmol/L (ref 98–111)
Creatinine, Ser: 0.65 mg/dL (ref 0.44–1.00)
GFR calc Af Amer: >60 mL/min (ref >60)
GFR calc non Af Amer: >60 mL/min (ref >60)
Glucose, Bld: 109 mg/dL — ABNORMAL HIGH (ref 70–99)
Potassium: 3.2 mmol/L — ABNORMAL LOW (ref 3.5–5.1)
Sodium: 138 mmol/L (ref 135–145)

## 2018-08-05 LAB — I-STAT BETA HCG BLOOD, ED (MC, WL, AP ONLY): I-stat hCG, quantitative: <5 m[IU]/mL (ref <5)

## 2018-08-05 LAB — CBC WITH DIFFERENTIAL/PLATELET
Abs Immature Granulocytes: 0.04 10*3/uL (ref 0.00–0.07)
Basophils Absolute: 0 10*3/uL (ref 0.0–0.1)
Basophils Relative: 0 %
Eosinophils Absolute: 0 10*3/uL (ref 0.0–0.5)
Eosinophils Relative: 0 %
HCT: 42.5 % (ref 36.0–46.0)
Hemoglobin: 14.5 g/dL (ref 12.0–15.0)
Immature Granulocytes: 0 %
Lymphocytes Relative: 22 %
Lymphs Abs: 2.5 10*3/uL (ref 0.7–4.0)
MCH: 31 pg (ref 26.0–34.0)
MCHC: 34.1 g/dL (ref 30.0–36.0)
MCV: 90.8 fL (ref 80.0–100.0)
Monocytes Absolute: 0.8 10*3/uL (ref 0.1–1.0)
Monocytes Relative: 7 %
Neutro Abs: 7.9 10*3/uL — ABNORMAL HIGH (ref 1.7–7.7)
Neutrophils Relative %: 71 %
Platelets: 282 10*3/uL (ref 150–400)
RBC: 4.68 MIL/uL (ref 3.87–5.11)
RDW: 13.1 % (ref 11.5–15.5)
WBC: 11.2 10*3/uL — ABNORMAL HIGH (ref 4.0–10.5)
nRBC: 0 % (ref 0.0–0.2)

## 2018-08-05 LAB — LACTIC ACID, PLASMA: Lactic Acid, Venous: 1.4 mmol/L (ref 0.5–1.9)

## 2018-08-05 LAB — SARS CORONAVIRUS 2 BY RT PCR (HOSPITAL ORDER, PERFORMED IN ~~LOC~~ HOSPITAL LAB): SARS Coronavirus 2: NEGATIVE

## 2018-08-05 MED ORDER — VANCOMYCIN HCL IN DEXTROSE 1-5 GM/200ML-% IV SOLN
1000.0000 mg | INTRAVENOUS | Status: AC
  Administered 2018-08-05: 21:00:00 1000 mg via INTRAVENOUS
  Filled 2018-08-05: qty 200

## 2018-08-05 MED ORDER — MORPHINE SULFATE (PF) 4 MG/ML IV SOLN
4.0000 mg | Freq: Once | INTRAVENOUS | Status: AC
  Administered 2018-08-05: 18:00:00 4 mg via INTRAVENOUS
  Filled 2018-08-05: qty 1

## 2018-08-05 MED ORDER — KCL IN DEXTROSE-NACL 20-5-0.9 MEQ/L-%-% IV SOLN
INTRAVENOUS | Status: DC
  Administered 2018-08-05: 21:00:00 via INTRAVENOUS
  Filled 2018-08-05 (×2): qty 1000

## 2018-08-05 MED ORDER — ONDANSETRON 4 MG PO TBDP: 4.0000 mg | ORAL_TABLET | Freq: Four times a day (QID) | ORAL | Status: DC | PRN

## 2018-08-05 MED ORDER — MORPHINE SULFATE (PF) 2 MG/ML IV SOLN
1.0000 mg | INTRAVENOUS | Status: DC | PRN
  Administered 2018-08-05 – 2018-08-07 (×10): 4 mg via INTRAVENOUS
  Administered 2018-08-07: 2 mg via INTRAVENOUS
  Administered 2018-08-08: 11:00:00 4 mg via INTRAVENOUS
  Filled 2018-08-05 (×10): qty 2
  Filled 2018-08-05: qty 1
  Filled 2018-08-05: qty 2

## 2018-08-05 MED ORDER — ONDANSETRON HCL 4 MG/2ML IJ SOLN: 4.0000 mg | Freq: Four times a day (QID) | INTRAMUSCULAR | Status: DC | PRN

## 2018-08-05 MED ORDER — VANCOMYCIN HCL IN DEXTROSE 1-5 GM/200ML-% IV SOLN
1000.0000 mg | Freq: Once | INTRAVENOUS | Status: AC
  Administered 2018-08-05: 17:00:00 1000 mg via INTRAVENOUS
  Filled 2018-08-05: qty 200

## 2018-08-05 MED ORDER — VANCOMYCIN HCL IN DEXTROSE 1-5 GM/200ML-% IV SOLN
1000.0000 mg | Freq: Two times a day (BID) | INTRAVENOUS | Status: DC
  Administered 2018-08-06 – 2018-08-07 (×3): 1000 mg via INTRAVENOUS
  Filled 2018-08-05 (×4): qty 200

## 2018-08-05 MED ORDER — MORPHINE SULFATE (PF) 4 MG/ML IV SOLN
4.0000 mg | Freq: Once | INTRAVENOUS | Status: AC
  Administered 2018-08-05: 16:00:00 4 mg via INTRAVENOUS
  Filled 2018-08-05: qty 1

## 2018-08-05 MED ORDER — PANTOPRAZOLE SODIUM 40 MG IV SOLR
40.0000 mg | Freq: Every day | INTRAVENOUS | Status: DC
  Administered 2018-08-05: 21:00:00 40 mg via INTRAVENOUS
  Filled 2018-08-05: qty 40

## 2018-08-05 NOTE — Progress Notes (Signed)
Pharmacy Antibiotic Note  Dana Morris is a 33 y.o. female admitted on 08/05/2018 with cellulitis.  Patient received Vancomycin 1gm IV x 1 dose @ 16:42.  Pharmacy has been consulted for Vancomycin dosing.  Plan:  Give an additional Vancomycin 1gm IV x 1 dose now (for total loading dose of 2000mg ) then will continue with Vancomycin 1gm IV q12h  Goal AUC 400-550.  Expected AUC: 480.8; SCr used: 0.8  Plan for I&D in the OR tomorrow  Height: 5\' 7"  (170.2 cm) Weight: 209 lb 14.1 oz (95.2 kg) IBW/kg (Calculated) : 61.6  Temp (24hrs), Avg:98.6 F (37 C), Min:98 F (36.7 C), Max:99 F (37.2 C)  Recent Labs  Lab 08/05/18 1538 08/05/18 1543  WBC 11.2*  --   CREATININE 0.65  --   LATICACIDVEN  --  1.4    Estimated Creatinine Clearance: 121.7 mL/min (by C-G formula based on SCr of 0.65 mg/dL).    No Known Allergies  Antimicrobials this admission: 5/10 vanc >>    Dose adjustments this admission:    Microbiology results: 5/10 Covid-19: negative   Thank you for allowing pharmacy to be a part of this patient's care.  Maryellen Pile, PharmD 08/05/2018 9:07 PM

## 2018-08-05 NOTE — ED Notes (Signed)
ED TO INPATIENT HANDOFF REPORT  ED Nurse Name and Phone #:  Esmond HarpsKatie L  S Name/Age/Gender Dana Morris 33 y.o. female Room/Bed: WA17/WA17  Code Status   Code Status: Full Code  Home/SNF/Other Home Patient oriented to: AOx4 Is this baseline? Yes   Triage Complete: Triage complete  Chief Complaint abscess  Triage Note Pt has been seen twice already for same, continues to have pain and swelling under rt axillary. Swelling is about the same as yesterday but pain is not allowing her to sleep.    Allergies No Known Allergies  Level of Care/Admitting Diagnosis ED Disposition    ED Disposition Condition Comment   Admit  [facility] [[id number]]  Level of Care: Med-Surg [16]  Covid Evaluation: Screening Protocol (No Symptoms)  Diagnosis: Right axillary hidradenitis [[id number]][[id number]]  Admitting Physician: Griselda MinerTH III, PAUL [phone][[id number]]  Attending Physician: CCS, MD [[id number]]  Estimated length of stay: 3 - 4 days  Certification:: I certify this patient will need inpatient services for at least 2 midnights  PT Class (Do Not Modify): Inpatient [101]  PT Acc Code (Do Not Modify): Private [1]       B Medical/Surgery History Past Medical History:  Diagnosis Date  . Abscess   . Asthma    History reviewed. No pertinent surgical history.   A IV Location/Drains/Wounds Patient Lines/Drains/Airways Status   Active Line/Drains/Airways    Name:   Placement date:   Placement time:   Site:   Days:   Peripheral IV 08/05/18 Left Antecubital   08/05/18    1552    Antecubital   less than 1          Intake/Output Last 24 hours  Intake/Output Summary (Last 24 hours) at 08/05/2018 2045 Last data filed at 08/05/2018 1752 Gross per 24 hour  Intake 200 ml  Output -  Net 200 ml    Labs/Imaging Results for orders placed or performed during the hospital encounter of 08/05/18 (from the past 48 hour(s))  CBC with Differential     Status: Abnormal   Collection Time:  08/05/18  3:38 PM  Result Value Ref Range   WBC 11.2 (H) 4.0 - 10.5 K/uL   RBC 4.68 3.87 - 5.11 MIL/uL   Hemoglobin 14.5 12.0 - 15.0 g/dL   HCT 14.742.5 82.936.0 - 56.246.0 %   MCV 90.8 80.0 - 100.0 fL   MCH 31.0 26.0 - 34.0 pg   MCHC 34.1 30.0 - 36.0 g/dL   RDW 13.013.1 86.511.5 - 78.415.5 %   Platelets 282 150 - 400 K/uL   nRBC 0.0 0.0 - 0.2 %   Neutrophils Relative % 71 %   Neutro Abs 7.9 (H) 1.7 - 7.7 K/uL   Lymphocytes Relative 22 %   Lymphs Abs 2.5 0.7 - 4.0 K/uL   Monocytes Relative 7 %   Monocytes Absolute 0.8 0.1 - 1.0 K/uL   Eosinophils Relative 0 %   Eosinophils Absolute 0.0 0.0 - 0.5 K/uL   Basophils Relative 0 %   Basophils Absolute 0.0 0.0 - 0.1 K/uL   Immature Granulocytes 0 %   Abs Immature Granulocytes 0.04 0.00 - 0.07 K/uL    Comment: Performed at [facility], [address] [place], [place] [id number]  Basic metabolic panel     Status: Abnormal   Collection Time: 08/05/18  3:38 PM  Result Value Ref Range   Sodium 138 135 - 145 mmol/L   Potassium 3.2 (L) 3.5 - 5.1 mmol/L  Chloride 100 98 - 111 mmol/L   CO2 24 22 - 32 mmol/L   Glucose, Bld 109 (H) 70 - 99 mg/dL   BUN 6 6 - 20 mg/dL   Creatinine, Ser 4.19 0.44 - 1.00 mg/dL   Calcium 9.6 8.9 - 37.9 mg/dL   GFR calc non Af Amer >60 >60 mL/min   GFR calc Af Amer >60 >60 mL/min   Anion gap 14 5 - 15    Comment: Performed at [facility], [address], [place], [place] [address]  Lactic acid, plasma     Status: None   Collection Time: 08/05/18  3:43 PM  Result Value Ref Range   Lactic Acid, Venous 1.4 0.5 - 1.9 mmol/L    Comment: Performed at [facility], [address] [place], [place] [address]  I-Stat beta hCG blood, ED ([facility], [facility], [facility] only)     Status: None   Collection Time: 08/05/18  4:00 PM  Result Value Ref Range   I-stat hCG, quantitative <5.0 <5 mIU/mL   Comment 3            Comment:   GEST. AGE      CONC.  (mIU/mL)   <=1 WEEK        5 - 50     2 WEEKS        50 - 500     3 WEEKS       100 - 10,000     4 WEEKS     1,000 - 30,000        FEMALE AND NON-PREGNANT FEMALE:     LESS THAN 5 mIU/mL   SARS Coronavirus 2 (CEPHEID - Performed in [facility] hospital lab), Hosp Order     Status: None   Collection Time: 08/05/18  4:45 PM  Result Value Ref Range   SARS Coronavirus 2 NEGATIVE NEGATIVE    Comment: (NOTE) If result is NEGATIVE SARS-CoV-2 target nucleic acids are NOT DETECTED. The SARS-CoV-2 RNA is generally detectable in upper and lower  respiratory specimens during the acute phase of infection. The lowest  concentration of SARS-CoV-2 viral copies this assay can detect is 250  copies / mL. A negative result does not preclude SARS-CoV-2 infection  and should not be used as the sole basis for treatment or other  patient management decisions.  A negative result may occur with  improper specimen collection / handling, submission of specimen other  than nasopharyngeal swab, presence of viral mutation(s) within the  areas targeted by this assay, and inadequate number of viral copies  (<250 copies / mL). A negative result must be combined with clinical  observations, patient history, and epidemiological information. If result is POSITIVE SARS-CoV-2 target nucleic acids are DETECTED. The SARS-CoV-2 RNA is generally detectable in upper and lower  respiratory specimens dur ing the acute phase of infection.  Positive  results are indicative of active infection with SARS-CoV-2.  Clinical  correlation with patient history and other diagnostic information is  necessary to determine patient infection status.  Positive results do  not rule out bacterial infection or co-infection with other viruses. If result is PRESUMPTIVE POSTIVE SARS-CoV-2 nucleic acids MAY BE PRESENT.   A presumptive positive result was obtained on the submitted specimen  and confirmed on repeat testing.  While 2019 novel coronavirus  (SARS-CoV-2) nucleic acids may be present in the  submitted sample  additional confirmatory testing may be necessary for epidemiological  and / or clinical management  purposes  to differentiate between  SARS-CoV-2 and other Sarbecovirus currently known to infect humans.  If clinically indicated additional testing with an alternate test  methodology [phone]) is advised. The SARS-CoV-2 RNA is generally  detectable in upper and lower respiratory sp ecimens during the acute  phase of infection. The expected result is Negative. Fact Sheet for Patients:  BoilerBrush.com.cy Fact Sheet for Healthcare Providers: https://pope.com/ This test is not yet approved or cleared by the [place] FDA and has been authorized for detection and/or diagnosis of SARS-CoV-2 by FDA under an Emergency Use Authorization (EUA).  This EUA will remain in effect (meaning this test can be used) for the duration of the COVID-19 declaration under Section 564(b)(1) of the Act, 21 U.S.C. section 360bbb-3(b)(1), unless the authorization is terminated or revoked sooner. Performed at [facility], [address], [place], [place] [address]    No results found.  Pending Labs Unresulted Labs (From admission, onward)    Start     Ordered   08/05/18 2046  HIV antibody (Routine Testing)  Once,   R     08/05/18 2045          Vitals/Pain Today's Vitals   08/05/18 1826 08/05/18 1919 08/05/18 1920 08/05/18 2037  BP: 134/86 128/90  (!) 138/102  Pulse: 100 91  (!) 114  Resp:  15  18  Temp:      TempSrc:      SpO2: 99% 99%  97%  Weight:      Height:      PainSc:  9  9  10-Worst pain ever    Isolation Precautions No active isolations  Medications Medications  dextrose 5 % and 0.9 % NaCl with KCl 20 mEq/L infusion (has no administration in time range)  morphine 2 MG/ML injection 1-4 mg (has no administration in time range)  ondansetron (ZOFRAN-ODT) disintegrating tablet 4 mg (has no  administration in time range)    Or  ondansetron (ZOFRAN) injection 4 mg (has no administration in time range)  pantoprazole (PROTONIX) injection 40 mg (has no administration in time range)  morphine 4 MG/ML injection 4 mg (4 mg Intravenous Given 08/05/18 1558)  vancomycin (VANCOCIN) IVPB 1000 mg/200 mL premix (0 mg Intravenous Stopped 08/05/18 1752)  morphine 4 MG/ML injection 4 mg (4 mg Intravenous Given 08/05/18 1823)    Mobility walks Low fall risk   Focused Assessments N/A   R Recommendations: See Admitting Provider Note  Report given to:   Additional Notes:  \

## 2018-08-05 NOTE — H&P (Signed)
Dana Morris is an 33 y.o. female.   Chief Complaint: axillary pain HPI: The patient is a 33 year old black female who presents with pain and swelling in the right axilla that began about 4 days ago.  She denies any fevers or chills.  She denies any drainage from the area.  She has had hidradenitis problems for several years.  The pain and swelling got bad enough that she came to the emergency department today.  Past Medical History:  Diagnosis Date  . Abscess   . Asthma     History reviewed. No pertinent surgical history.  Family History  Problem Relation Age of Onset  . Hypertension Mother   . Diabetes Maternal Uncle    Social History:  reports that she has quit smoking. She has never used smokeless tobacco. She reports previous alcohol use. She reports previous drug use.  Allergies: No Known Allergies  (Not in a hospital admission)   Results for orders placed or performed during the hospital encounter of 08/05/18 (from the past 48 hour(s))  CBC with Differential     Status: Abnormal   Collection Time: 08/05/18  3:38 PM  Result Value Ref Range   WBC 11.2 (H) 4.0 - 10.5 K/uL   RBC 4.68 3.87 - 5.11 MIL/uL   Hemoglobin 14.5 12.0 - 15.0 g/dL   HCT 16.142.5 09.636.0 - 04.546.0 %   MCV 90.8 80.0 - 100.0 fL   MCH 31.0 26.0 - 34.0 pg   MCHC 34.1 30.0 - 36.0 g/dL   RDW 40.913.1 81.111.5 - 91.415.5 %   Platelets 282 150 - 400 K/uL   nRBC 0.0 0.0 - 0.2 %   Neutrophils Relative % 71 %   Neutro Abs 7.9 (H) 1.7 - 7.7 K/uL   Lymphocytes Relative 22 %   Lymphs Abs 2.5 0.7 - 4.0 K/uL   Monocytes Relative 7 %   Monocytes Absolute 0.8 0.1 - 1.0 K/uL   Eosinophils Relative 0 %   Eosinophils Absolute 0.0 0.0 - 0.5 K/uL   Basophils Relative 0 %   Basophils Absolute 0.0 0.0 - 0.1 K/uL   Immature Granulocytes 0 %   Abs Immature Granulocytes 0.04 0.00 - 0.07 K/uL    Comment: Performed at [facility], [address] [place], [place] [address]  Basic metabolic panel     Status: Abnormal    Collection Time: 08/05/18  3:38 PM  Result Value Ref Range   Sodium 138 135 - 145 mmol/L   Potassium 3.2 (L) 3.5 - 5.1 mmol/L   Chloride 100 98 - 111 mmol/L   CO2 24 22 - 32 mmol/L   Glucose, Bld 109 (H) 70 - 99 mg/dL   BUN 6 6 - 20 mg/dL   Creatinine, Ser 6.210.65 0.44 - 1.00 mg/dL   Calcium 9.6 8.9 - 30.810.3 mg/dL   GFR calc non Af Amer >60 >60 mL/min   GFR calc Af Amer >60 >60 mL/min   Anion gap 14 5 - 15    Comment: Performed at [facility], [address] [place], [place] [address]  Lactic acid, plasma     Status: None   Collection Time: 08/05/18  3:43 PM  Result Value Ref Range   Lactic Acid, Venous 1.4 0.5 - 1.9 mmol/L    Comment: Performed at [facility], [address] [place], [place] [address]  I-Stat beta hCG blood, ED [facility], [facility], [facility] only)     Status: None   Collection Time: 08/05/18  4:00 PM  Result Value Ref Range   I-stat hCG, quantitative <5.0 <5 mIU/mL   Comment 3            Comment:   GEST. AGE      CONC.  (mIU/mL)   <=1 WEEK        5 - 50     2 WEEKS       50 - 500     3 WEEKS       100 - 10,000     4 WEEKS     1,000 - 30,000        FEMALE AND NON-PREGNANT FEMALE:     LESS THAN 5 mIU/mL   SARS Coronavirus 2 (CEPHEID - Performed in [facility] hospital lab), Hosp Order     Status: None   Collection Time: 08/05/18  4:45 PM  Result Value Ref Range   SARS Coronavirus 2 NEGATIVE NEGATIVE    Comment: (NOTE) If result is NEGATIVE SARS-CoV-2 target nucleic acids are NOT DETECTED. The SARS-CoV-2 RNA is generally detectable in upper and lower  respiratory specimens during the acute phase of infection. The lowest  concentration of SARS-CoV-2 viral copies this assay can detect is 250  copies / mL. A negative result does not preclude SARS-CoV-2 infection  and should not be used as the sole basis for treatment or other  patient management decisions.  A negative result may occur with  improper specimen collection / handling,  submission of specimen other  than nasopharyngeal swab, presence of viral mutation(s) within the  areas targeted by this assay, and inadequate number of viral copies  (<250 copies / mL). A negative result must be combined with clinical  observations, patient history, and epidemiological information. If result is POSITIVE SARS-CoV-2 target nucleic acids are DETECTED. The SARS-CoV-2 RNA is generally detectable in upper and lower  respiratory specimens dur ing the acute phase of infection.  Positive  results are indicative of active infection with SARS-CoV-2.  Clinical  correlation with patient history and other diagnostic information is  necessary to determine patient infection status.  Positive results do  not rule out bacterial infection or co-infection with other viruses. If result is PRESUMPTIVE POSTIVE SARS-CoV-2 nucleic acids MAY BE PRESENT.   A presumptive positive result was obtained on the submitted specimen  and confirmed on repeat testing.  While 2019 novel coronavirus  (SARS-CoV-2) nucleic acids may be present in the submitted sample  additional confirmatory testing may be necessary for epidemiological  and / or clinical management purposes  to differentiate between  SARS-CoV-2 and other Sarbecovirus currently known to infect humans.  If clinically indicated additional testing with an alternate test  methodology [phone]) is advised. The SARS-CoV-2 RNA is generally  detectable in upper and lower respiratory sp ecimens during the acute  phase of infection. The expected result is Negative. Fact Sheet for Patients:  BoilerBrush.com.cy Fact Sheet for Healthcare Providers: https://pope.com/ This test is not yet approved or cleared by the [place] FDA and has been authorized for detection and/or diagnosis of SARS-CoV-2 by FDA under an Emergency Use Authorization (EUA).  This EUA will remain in effect (meaning this test can be  used) for the duration of the COVID-19 declaration under Section 564(b)(1) of the Act, 21 U.S.C. section 360bbb-3(b)(1), unless the authorization is terminated or revoked sooner. Performed at [facility], [address] [place], [place] [address]    No results found.  Review of Systems  Constitutional: Negative.  HENT: Negative.   Eyes: Negative.   Respiratory: Negative.   Cardiovascular: Negative.   Gastrointestinal: Negative.   Genitourinary: Negative.   Musculoskeletal: Negative.   Skin: Negative.   Endo/Heme/Allergies: Negative.   Psychiatric/Behavioral: Negative.     Blood pressure 128/90, pulse 91, temperature 98.7 F (37.1 C), temperature source Oral, resp. rate 15, height  (1.702 m), weight 95.2 kg, last menstrual period 07/14/2018, SpO2 99 %. Physical Exam  Constitutional: She is oriented to person, place, and time. She appears well-developed and well-nourished. No distress.  HENT:  Head: Normocephalic and atraumatic.  Mouth/Throat: No oropharyngeal exudate.  Eyes: Pupils are equal, round, and reactive to light. Conjunctivae and EOM are normal.  Neck: Normal range of motion. Neck supple.  Cardiovascular: Normal rate, regular rhythm and normal heart sounds.  Respiratory: Effort normal and breath sounds normal. No stridor. No respiratory distress.  GI: Soft. Bowel sounds are normal. There is no abdominal tenderness.  Musculoskeletal: Normal range of motion.        General: No tenderness or edema.  Neurological: She is alert and oriented to person, place, and time. Coordination normal.  Skin: Skin is warm and dry.  There is an area of swelling and tenderness with some cellulitis in the right axilla  Psychiatric: She has a normal mood and affect. Her behavior is normal. Thought content normal.     Assessment/Plan The patient appears to have a significant area of hidradenitis in the right axilla.  It is so tender to touch that this will need  to be incised and drained in the operating room.  Since she had dinner we will plan to admit her to the hospital and start her on IV vancomycin.  We will plan for surgical drainage in the morning in the operating room.  I have discussed with her in detail the risks and benefits of the operation as well as some of the technical aspects and she understands and wishes to proceed.  Chevis Pretty III, MD 08/05/2018, 7:44 PM

## 2018-08-05 NOTE — ED Notes (Signed)
Pt ate some dinner, wants tray to remain as she will eat a little more later, states pain is still there but not as bad, trying to rest.

## 2018-08-05 NOTE — ED Provider Notes (Addendum)
[facility]-EMERGENCY DEPT Provider Note   CSN: [id number] Arrival date & time: 08/05/18  1423    History   Chief Complaint Chief Complaint  Patient presents with  . Abscess    HPI    Dana Morris is a 33 y.o. female with a PMHx of recurrent abscesses and asthma, who presents to the ED with complaints of worsening R axilla abscess/cellulitis.  Per chart review, pt was seen for this at [facility] on 08/02/18 and I&D was performed, she was discharged with doxycycline and 6 tabs of norco pain med; she returned yesterday to the ED and repeat I&D was performed and she was discharged with no new medications.  She reports being compliant with her medications, however she states that the pain is worsening and she has had worsening swelling of the right axillary abscesses.  She describes the pain as 10/10 constant throbbing sharp sore nonradiating pain in the right axilla, worse with movement or palpation of the area, and unrelieved with Norco and Tylenol.  She reports associated erythema and warmth to the area.  She has not had any drainage or red streaking.  She does not have a PCP at this time.  She has not seen a Careers adviser for this.  She has had abscesses before, but never this bad.  She denies fevers, chills, CP, SOB, abd pain, N/V/D/C, hematuria, dysuria, numbness, tingling, focal weakness, or any other complaints at this time.   The history is provided by the patient and medical records. No language interpreter was used.  Abscess  Associated symptoms: no fever, no nausea and no vomiting     Past Medical History:  Diagnosis Date  . Abscess   . Asthma     There are no active problems to display for this patient.   History reviewed. No pertinent surgical history.   OB History   No obstetric history on file.      Home Medications    Prior to Admission medications   Medication Sig Start Date End Date Taking? Authorizing Provider  acetaminophen (TYLENOL) 500 MG  tablet Take 500 mg by mouth every 6 (six) hours as needed for pain.    [provider]  doxycycline (VIBRAMYCIN) 100 MG capsule Take 1 capsule (100 mg total) by mouth 2 (two) times daily for 7 days. 08/02/18 08/09/18  Wieters, Hallie C, PA-C  HYDROcodone-acetaminophen (NORCO/VICODIN) 5-325 MG tablet Take 1 tablet by mouth every 4 (four) hours as needed for severe pain. 08/02/18   Wieters, Junius Creamer, PA-C    Family History Family History  Problem Relation Age of Onset  . Hypertension Mother   . Diabetes Maternal Uncle     Social History Social History   Tobacco Use  . Smoking status: Former Games developer  . Smokeless tobacco: Never Used  Substance Use Topics  . Alcohol use: Not Currently    Comment: occasional   . Drug use: Not Currently     Allergies   Patient has no known allergies.   Review of Systems Review of Systems  Constitutional: Negative for chills and fever.  Respiratory: Negative for shortness of breath.   Cardiovascular: Negative for chest pain.  Gastrointestinal: Negative for abdominal pain, constipation, diarrhea, nausea and vomiting.  Genitourinary: Negative for dysuria and hematuria.  Musculoskeletal: Positive for myalgias. Negative for arthralgias.  Skin: Positive for color change and wound.  Allergic/Immunologic: Negative for immunocompromised state.  Neurological: Negative for weakness and numbness.  Psychiatric/Behavioral: Negative for confusion.   All other systems  reviewed and are negative for acute change except as noted in the HPI.    Physical Exam Updated Vital Signs BP (!) 130/91 (BP Location: Right Arm)   Pulse 82   Temp 98.7 F (37.1 C) (Oral)   Resp 18   Ht  (1.702 m)   Wt 95.2 kg   LMP 07/14/2018   SpO2 100% Comment: Simultaneous filing. User may not have seen previous data.  BMI 32.87 kg/m   Physical Exam Vitals signs and nursing note reviewed.  Constitutional:      General: She is not in acute distress.    Appearance:  Normal appearance. She is well-developed. She is not toxic-appearing.     Comments: Afebrile, nontoxic, NAD although tearful  HENT:     Head: Normocephalic and atraumatic.  Eyes:     General:        Right eye: No discharge.        Left eye: No discharge.     Conjunctiva/sclera: Conjunctivae normal.  Neck:     Musculoskeletal: Normal range of motion and neck supple.  Cardiovascular:     Rate and Rhythm: Normal rate.     Pulses: Normal pulses.  Pulmonary:     Effort: Pulmonary effort is normal. No respiratory distress.  Abdominal:     General: There is no distension.  Musculoskeletal: Normal range of motion.  Skin:    General: Skin is warm and dry.     Findings: Abscess and erythema present.     Comments: ~6cm of fluctuance abscesses to the R axilla, in several areas of the axilla, with surrounding induration extending to the lateral chest wall near the axilla and somewhat onto the posterior upper arm, moderate erythema and warmth of these areas, no drainage. Difficult to visualize incisions from prior I&D due to pt uncooperativeness due to pain. Exquisitely TTP. Multiple skin pits resembling hydradenitis appearance.   Neurological:     Mental Status: She is alert and oriented to person, place, and time.     Sensory: Sensation is intact. No sensory deficit.     Motor: Motor function is intact.  Psychiatric:        Mood and Affect: Mood and affect normal.        Behavior: Behavior normal.      ED Treatments / Results  Labs (all labs ordered are listed, but only abnormal results are displayed) Labs Reviewed  CBC WITH DIFFERENTIAL/PLATELET - Abnormal; Notable for the following components:      Result Value   WBC 11.2 (*)    Neutro Abs 7.9 (*)    All other components within normal limits  SARS CORONAVIRUS 2 (HOSPITAL ORDER, PERFORMED IN [facility])  BASIC METABOLIC PANEL  LACTIC ACID, PLASMA  I-STAT BETA HCG BLOOD, ED ([facility], [facility], [facility] ONLY)    EKG None   Radiology No results found.  Procedures Procedures (including critical care time)  Medications Ordered in ED Medications  morphine 4 MG/ML injection 4 mg (4 mg Intravenous Given 08/05/18 1558)     Initial Impression / Assessment and Plan / ED Course  I have reviewed the triage vital signs and the nursing notes.  Pertinent labs & imaging results that were available during my care of the patient were reviewed by me and considered in my medical decision making (see chart for details).        33 y.o. female here with worsening abscess/cellulitis to the R axilla, has had 2 I&Ds done in the last  3 days, last one yesterday, was sent home on doxycycline on 08/02/18 and has been compliant with it. Reports worsening pain and swelling. On exam, R axilla with multiple areas of fluctuance and surrounding induration and erythema, no red streaking, exquisitely TTP, no drainage. Looks like hydradenitis suppurativa. Unclear where I&Ds were performed, cannot visualize the incisions but exam limited due to pt uncooperativeness due to pain. Concern for deeper/more involved abscess that may require operative management and IV abx given failure of tx thus far. Will get labs and likely consult surgery. Will give pain meds. Discussed case with my attending Dr. Effie ShyWentz who agrees with plan.   4:16 PM CBC w/diff with leukocytosis WBC 11.2. BetaHCG neg. Will consult surgery to discuss case. BMP and lactic pending. Will reassess shortly.   4:24 PM BMP and lactic still pending. Dr. Carolynne Edouardoth of general surgery returning page, wants IV vanc ordered and will admit her, will likely not operate tonight. IV vanc ordered, but remainder of holding orders to be placed by admitting team. Please see their notes for further documentation of care. I appreciate their help with this pleasant pt's care. Pt stable at time of admission. Of note, COVID19 test ordered per protocol for screening.     Final Clinical Impressions(s) / ED  Diagnoses   Final diagnoses:  Abscess of axilla, right  Cellulitis of right axilla    ED Discharge Orders    [address]         June Vacha, [place], [place]-C 08/05/18 1630    Mancel BaleWentz, Elliott, MD 08/06/18 1018

## 2018-08-05 NOTE — ED Triage Notes (Signed)
Pt has been seen twice already for same, continues to have pain and swelling under rt axillary. Swelling is about the same as yesterday but pain is not allowing her to sleep.

## 2018-08-05 NOTE — ED Provider Notes (Signed)
  Face-to-face evaluation   History: She presents for persistent pain and swelling right axilla despite being seen yesterday and 5 days ago for treatment of same with I&D, x2.  She complains of "sharp pain" in the axilla radiating to the right chest.  She denies fever, chills, nausea, vomiting, weakness or dizziness.  Physical exam: Right axilla with moderate tenderness, swelling and erythema, no drainage.  Patient resists movement of the right shoulder secondary to pain, and guards against touch.  Medical screening examination/treatment/procedure(s) were conducted as a shared visit with non-physician practitioner(s) and myself.  I personally evaluated the patient during the encounter    Mancel Bale, MD 08/06/18 1017

## 2018-08-06 ENCOUNTER — Encounter (HOSPITAL_COMMUNITY): Admission: EM | Disposition: A | Discharge status: Home / Self Care | Payer: Self-pay | Source: Home / Self Care

## 2018-08-06 ENCOUNTER — Inpatient Hospital Stay (HOSPITAL_COMMUNITY): Payer: Self-pay | Admitting: Certified Registered Nurse Anesthetist

## 2018-08-06 ENCOUNTER — Encounter (HOSPITAL_COMMUNITY): Payer: Self-pay | Admitting: Emergency Medicine

## 2018-08-06 HISTORY — PX: IRRIGATION AND DEBRIDEMENT ABSCESS: SHX5252

## 2018-08-06 LAB — SURGICAL PCR SCREEN
MRSA, PCR: NEGATIVE
Staphylococcus aureus: NEGATIVE

## 2018-08-06 SURGERY — IRRIGATION AND DEBRIDEMENT ABSCESS
Anesthesia: General | Site: Axilla | Laterality: Right

## 2018-08-06 MED ORDER — PROPOFOL 10 MG/ML IV BOLUS
INTRAVENOUS | Status: AC
  Filled 2018-08-06: qty 20

## 2018-08-06 MED ORDER — FENTANYL CITRATE (PF) 100 MCG/2ML IJ SOLN
INTRAMUSCULAR | Status: DC | PRN
  Administered 2018-08-06 (×2): 50 ug via INTRAVENOUS
  Administered 2018-08-06 (×2): 25 ug via INTRAVENOUS

## 2018-08-06 MED ORDER — MIDAZOLAM HCL 5 MG/5ML IJ SOLN
INTRAMUSCULAR | Status: DC | PRN
  Administered 2018-08-06: 2 mg via INTRAVENOUS

## 2018-08-06 MED ORDER — FENTANYL CITRATE (PF) 100 MCG/2ML IJ SOLN: 25.0000 ug | INTRAMUSCULAR | Status: DC | PRN

## 2018-08-06 MED ORDER — DEXAMETHASONE SODIUM PHOSPHATE 10 MG/ML IJ SOLN
INTRAMUSCULAR | Status: AC
  Filled 2018-08-06: qty 1

## 2018-08-06 MED ORDER — LIDOCAINE 2% (20 MG/ML) 5 ML SYRINGE
INTRAMUSCULAR | Status: AC
  Filled 2018-08-06: qty 5

## 2018-08-06 MED ORDER — BUPIVACAINE-EPINEPHRINE (PF) 0.25% -1:200000 IJ SOLN
INTRAMUSCULAR | Status: AC
  Filled 2018-08-06: qty 30

## 2018-08-06 MED ORDER — ONDANSETRON HCL 4 MG/2ML IJ SOLN
INTRAMUSCULAR | Status: AC
  Filled 2018-08-06: qty 2

## 2018-08-06 MED ORDER — LIDOCAINE 2% (20 MG/ML) 5 ML SYRINGE
INTRAMUSCULAR | Status: DC | PRN
  Administered 2018-08-06: 100 mg via INTRAVENOUS

## 2018-08-06 MED ORDER — BUPIVACAINE-EPINEPHRINE 0.25% -1:200000 IJ SOLN
INTRAMUSCULAR | Status: DC | PRN
  Administered 2018-08-06: 25 mL
  Administered 2018-08-06: 5 mL

## 2018-08-06 MED ORDER — OXYCODONE HCL 5 MG/5ML PO SOLN: 5.0000 mg | Freq: Once | ORAL | Status: DC | PRN

## 2018-08-06 MED ORDER — GABAPENTIN 300 MG PO CAPS
300.0000 mg | ORAL_CAPSULE | Freq: Once | ORAL | Status: AC
  Administered 2018-08-06: 10:00:00 300 mg via ORAL
  Filled 2018-08-06: qty 1

## 2018-08-06 MED ORDER — MIDAZOLAM HCL 2 MG/2ML IJ SOLN
INTRAMUSCULAR | Status: AC
  Filled 2018-08-06: qty 2

## 2018-08-06 MED ORDER — PROPOFOL 10 MG/ML IV BOLUS
INTRAVENOUS | Status: DC | PRN
  Administered 2018-08-06: 200 mg via INTRAVENOUS

## 2018-08-06 MED ORDER — SUCCINYLCHOLINE CHLORIDE 200 MG/10ML IV SOSY
PREFILLED_SYRINGE | INTRAVENOUS | Status: AC
  Filled 2018-08-06: qty 10

## 2018-08-06 MED ORDER — DEXAMETHASONE SODIUM PHOSPHATE 10 MG/ML IJ SOLN
INTRAMUSCULAR | Status: DC | PRN
  Administered 2018-08-06: 10 mg via INTRAVENOUS

## 2018-08-06 MED ORDER — OXYCODONE HCL 5 MG PO TABS: 5.0000 mg | ORAL_TABLET | Freq: Once | ORAL | Status: DC | PRN

## 2018-08-06 MED ORDER — ACETAMINOPHEN 500 MG PO TABS
1000.0000 mg | ORAL_TABLET | Freq: Once | ORAL | Status: AC
  Administered 2018-08-06: 1000 mg via ORAL
  Filled 2018-08-06: qty 2

## 2018-08-06 MED ORDER — ROCURONIUM BROMIDE 10 MG/ML (PF) SYRINGE
PREFILLED_SYRINGE | INTRAVENOUS | Status: AC
  Filled 2018-08-06: qty 10

## 2018-08-06 MED ORDER — FENTANYL CITRATE (PF) 250 MCG/5ML IJ SOLN
INTRAMUSCULAR | Status: AC
  Filled 2018-08-06: qty 5

## 2018-08-06 MED ORDER — LACTATED RINGERS IV SOLN
INTRAVENOUS | Status: DC
  Administered 2018-08-06: 09:00:00 via INTRAVENOUS

## 2018-08-06 MED ORDER — ONDANSETRON HCL 4 MG/2ML IJ SOLN: 4.0000 mg | Freq: Once | INTRAMUSCULAR | Status: DC | PRN

## 2018-08-06 MED ORDER — ACETAMINOPHEN 325 MG PO TABS
650.0000 mg | ORAL_TABLET | Freq: Four times a day (QID) | ORAL | Status: DC
  Administered 2018-08-06 – 2018-08-07 (×4): 650 mg via ORAL
  Filled 2018-08-06 (×4): qty 2

## 2018-08-06 MED ORDER — OXYCODONE HCL 5 MG PO TABS
5.0000 mg | ORAL_TABLET | ORAL | Status: DC | PRN
  Administered 2018-08-06 – 2018-08-07 (×3): 5 mg via ORAL
  Filled 2018-08-06 (×3): qty 1

## 2018-08-06 MED ORDER — GABAPENTIN 300 MG PO CAPS
300.0000 mg | ORAL_CAPSULE | Freq: Three times a day (TID) | ORAL | Status: DC
  Administered 2018-08-06 – 2018-08-08 (×6): 300 mg via ORAL
  Filled 2018-08-06 (×6): qty 1

## 2018-08-06 MED ORDER — ONDANSETRON HCL 4 MG/2ML IJ SOLN
INTRAMUSCULAR | Status: DC | PRN
  Administered 2018-08-06: 4 mg via INTRAVENOUS

## 2018-08-06 MED ORDER — METHOCARBAMOL 500 MG PO TABS
500.0000 mg | ORAL_TABLET | Freq: Four times a day (QID) | ORAL | Status: DC | PRN
  Administered 2018-08-07: 500 mg via ORAL
  Filled 2018-08-06: qty 1

## 2018-08-06 MED ORDER — KETOROLAC TROMETHAMINE 30 MG/ML IJ SOLN: 30.0000 mg | Freq: Once | INTRAMUSCULAR | Status: DC | PRN

## 2018-08-06 SURGICAL SUPPLY — 43 items
ADH SKN CLS APL DERMABOND .7 (GAUZE/BANDAGES/DRESSINGS)
BLADE HEX COATED 2.75 (ELECTRODE) ×2 IMPLANT
BLADE SURG SZ10 CARB STEEL (BLADE) ×2 IMPLANT
BNDG GAUZE ELAST 4 BULKY (GAUZE/BANDAGES/DRESSINGS) ×1 IMPLANT
COVER SURGICAL LIGHT HANDLE (MISCELLANEOUS) ×2 IMPLANT
COVER WAND RF STERILE (DRAPES) IMPLANT
DECANTER SPIKE VIAL GLASS SM (MISCELLANEOUS) IMPLANT
DERMABOND ADVANCED (GAUZE/BANDAGES/DRESSINGS)
DERMABOND ADVANCED .7 DNX12 (GAUZE/BANDAGES/DRESSINGS) IMPLANT
DRAPE LAPAROSCOPIC ABDOMINAL (DRAPES) IMPLANT
DRAPE LAPAROTOMY T 102X78X121 (DRAPES) IMPLANT
DRAPE LAPAROTOMY T 98X78 PEDS (DRAPES) ×1 IMPLANT
DRAPE LAPAROTOMY TRNSV 102X78 (DRAPE) IMPLANT
DRAPE SHEET LG 3/4 BI-LAMINATE (DRAPES) IMPLANT
DRAPE UNDERBUTTOCKS STRL (DRAPE) ×1 IMPLANT
DRSG PAD ABDOMINAL 8X10 ST (GAUZE/BANDAGES/DRESSINGS) ×1 IMPLANT
ELECT PENCIL ROCKER SW 15FT (MISCELLANEOUS) ×1 IMPLANT
ELECT REM PT RETURN 15FT ADLT (MISCELLANEOUS) ×2 IMPLANT
GAUZE SPONGE 4X4 12PLY STRL (GAUZE/BANDAGES/DRESSINGS) ×2 IMPLANT
GLOVE BIO SURGEON STRL SZ7 (GLOVE) ×6 IMPLANT
GLOVE BIOGEL PI IND STRL 7.0 (GLOVE) ×1 IMPLANT
GLOVE BIOGEL PI IND STRL 7.5 (GLOVE) ×3 IMPLANT
GLOVE BIOGEL PI INDICATOR 7.0 (GLOVE) ×1
GLOVE BIOGEL PI INDICATOR 7.5 (GLOVE) ×3
GLOVE SURG SIGNA 7.5 PF LTX (GLOVE) ×2 IMPLANT
GOWN STRL REUS W/ TWL XL LVL3 (GOWN DISPOSABLE) ×1 IMPLANT
GOWN STRL REUS W/TWL LRG LVL3 (GOWN DISPOSABLE) ×4 IMPLANT
GOWN STRL REUS W/TWL XL LVL3 (GOWN DISPOSABLE) ×4 IMPLANT
KIT BASIN OR (CUSTOM PROCEDURE TRAY) ×2 IMPLANT
KIT TURNOVER KIT A (KITS) IMPLANT
MARKER SKIN DUAL TIP RULER LAB (MISCELLANEOUS) IMPLANT
NEEDLE HYPO 25X1 1.5 SAFETY (NEEDLE) ×2 IMPLANT
NS IRRIG 1000ML POUR BTL (IV SOLUTION) IMPLANT
PACK BASIC VI WITH GOWN DISP (CUSTOM PROCEDURE TRAY) IMPLANT
PACK GENERAL/GYN (CUSTOM PROCEDURE TRAY) ×1 IMPLANT
SPONGE LAP 18X18 RF (DISPOSABLE) IMPLANT
SPONGE LAP 4X18 RFD (DISPOSABLE) IMPLANT
STAPLER VISISTAT 35W (STAPLE) IMPLANT
SUT MNCRL AB 4-0 PS2 18 (SUTURE) IMPLANT
SUT VIC AB 3-0 SH 18 (SUTURE) IMPLANT
SYR CONTROL 10ML LL (SYRINGE) ×2 IMPLANT
TOWEL OR 17X26 10 PK STRL BLUE (TOWEL DISPOSABLE) ×2 IMPLANT
YANKAUER SUCT BULB TIP 10FT TU (MISCELLANEOUS) ×1 IMPLANT

## 2018-08-06 NOTE — Anesthesia Postprocedure Evaluation (Signed)
Anesthesia Post Note  Patient: Dana Morris  Procedure(s) Performed: IRRIGATION AND DEBRIDEMENT AXILLARY ABSCESS (Right Axilla)     Patient location during evaluation: PACU Anesthesia Type: General Level of consciousness: awake and alert Pain management: pain level controlled Vital Signs Assessment: post-procedure vital signs reviewed and stable Respiratory status: spontaneous breathing, nonlabored ventilation, respiratory function stable and patient connected to nasal cannula oxygen Cardiovascular status: blood pressure returned to baseline and stable Postop Assessment: no apparent nausea or vomiting Anesthetic complications: no    Last Vitals:  Vitals:   08/06/18 1230 08/06/18 1333  BP: 133/87 128/84  Pulse: 78 65  Resp: 15 18  Temp: 36.9 C 36.7 C  SpO2: 99% 98%    Last Pain:  Vitals:   08/06/18 1333  TempSrc: Oral  PainSc:                  Trevor Iha

## 2018-08-06 NOTE — Op Note (Signed)
Operative Note  Dana Morris  [id number]  [id number]  08/06/2018   Surgeon: Leeroy Bock A ConnorMD  Assistant: none  Procedure performed: incision and debridement of right axillary abscess/ hidradenitis, skin and subcutaneous tissue 8x5x2cm  Preop diagnosis: hidradenitis with recurrent abscess Post-op diagnosis/intraop findings: same  Specimens: cultures Retained items: kerlix packing to be changed in 24h EBL: minimal cc Complications: none  Description of procedure: After obtaining informed consent the patient was taken to the operating room and placed supine on operating room table wheregeneral LMA anesthesia was initiated, preoperative antibiotics were administered, SCDs applied, and a formal timeout was performed. The right axilla and chest wall were prepped and draped in usual sterile fashion.  She has a very large abscess involving essentially the entire lower half of the axilla extending towards the chest wall inferiorly.  Chronic changes of hidradenitis including chronic scar and sinus tracts are present along the inferior half of the axillary hairbearing skin.  No gross disease of the superior aspect of the axilla.  One of the fluctuant areas was incised sharply and then cultures were taken of the copious amounts of purulent drainage that were then evacuated.  Using the Bovie cautery, I excised the hairbearing skin of the inferior half of the axilla where chronic sequelae of hidradenitis were present as well as some very thinned and ischemic looking skin adjacent to this, creating a shallow wound for easy packing, and excising the areas of active/ chronic disease. The wound was probed and no deep tracts or other sinus were identified. Hemostasis was ensured with cautery. Local was infiltrated around the wound and also applied to the packing. Saline/ local moistened kerlix were then packed in the wound and covered with dry gauze and ABDs.  The patient was then awakened, extubated  and taken to PACU in stable condition.   All counts were correct at the completion of the case.

## 2018-08-06 NOTE — Anesthesia Procedure Notes (Signed)
Procedure Name: LMA Insertion Date/Time: 08/06/2018 10:47 AM Performed by: Orest Dikes, CRNA Pre-anesthesia Checklist: Patient identified, Emergency Drugs available, Suction available and Patient being monitored Patient Re-evaluated:Patient Re-evaluated prior to induction Oxygen Delivery Method: Circle system utilized Preoxygenation: Pre-oxygenation with 100% oxygen Induction Type: IV induction LMA: LMA inserted LMA Size: 4.0 Number of attempts: 1 Placement Confirmation: positive ETCO2 and breath sounds checked- equal and bilateral Tube secured with: Tape Dental Injury: Teeth and Oropharynx as per pre-operative assessment

## 2018-08-06 NOTE — Anesthesia Preprocedure Evaluation (Signed)
Anesthesia Evaluation  Patient identified by MRN, date of birth, ID band Patient awake    Reviewed: Allergy & Precautions, NPO status , Patient's Chart, lab work & pertinent test results  Airway Mallampati: II  TM Distance: >3 FB Neck ROM: Full    Dental no notable dental hx. (+) Teeth Intact, Dental Advisory Given   Pulmonary asthma , former smoker,    Pulmonary exam normal breath sounds clear to auscultation       Cardiovascular Exercise Tolerance: Good negative cardio ROS Normal cardiovascular exam Rhythm:Regular Rate:Normal     Neuro/Psych negative neurological ROS  negative psych ROS   GI/Hepatic Neg liver ROS,   Endo/Other  negative endocrine ROS  Renal/GU K+ 3.2     Musculoskeletal negative musculoskeletal ROS (+)   Abdominal (+) + obese,   Peds  Hematology Hgb 14.5 Plt 282   Anesthesia Other Findings   Reproductive/Obstetrics negative OB ROS                            Anesthesia Physical Anesthesia Plan  ASA: II  Anesthesia Plan: General   Post-op Pain Management:    Induction: Intravenous  PONV Risk Score and Plan: 3 and Treatment may vary due to age or medical condition, Ondansetron and Dexamethasone  Airway Management Planned: LMA  Additional Equipment:   Intra-op Plan:   Post-operative Plan:   Informed Consent: I have reviewed the patients History and Physical, chart, labs and discussed the procedure including the risks, benefits and alternatives for the proposed anesthesia with the patient or authorized representative who has indicated his/her understanding and acceptance.     Dental advisory given  Plan Discussed with:   Anesthesia Plan Comments:        Anesthesia Quick Evaluation

## 2018-08-06 NOTE — Progress Notes (Signed)
  Day of Surgery   Subjective/Chief Complaint: Continues to have severe pain   Objective: Vital signs in last 24 hours: Temp:  [98 F (36.7 C)-99 F (37.2 C)] 98.2 F (36.8 C) (05/11 0542) Pulse Rate:  [82-114] 88 (05/11 0542) Resp:  [15-18] 18 (05/11 0542) BP: (124-138)/(76-102) 134/78 (05/11 0542) SpO2:  [97 %-100 %] 100 % (05/11 0542) Weight:  [95.2 kg] 95.2 kg (05/10 1442) Last BM Date: 08/03/18  Intake/Output from previous day: 05/10 0701 - 05/11 0700 In: 1364.3 [P.O.:240; I.V.:683.2; IV Piggyback:441.1] Out: 800 [Urine:800] Intake/Output this shift: No intake/output data recorded.  General appearance: alert and cooperative Resp: unlabored Cardio: regular rate and rhythm GI: s/nt/nd Extremities: extremities normal, atraumatic, no cyanosis or edema Skin: erythema and swelling R axilla, complete exam will be done in OR Neurologic: Grossly normal  Lab Results:  Recent Labs    08/05/18 1538  WBC 11.2*  HGB 14.5  HCT 42.5  PLT 282   BMET Recent Labs    08/05/18 1538  NA 138  K 3.2*  CL 100  CO2 24  GLUCOSE 109*  BUN 6  CREATININE 0.65  CALCIUM 9.6   PT/INR No results for input(s): LABPROT, INR in the last 72 hours. ABG No results for input(s): PHART, HCO3 in the last 72 hours.  Invalid input(s): PCO2, PO2  Studies/Results: No results found.  Anti-infectives: Anti-infectives (From admission, onward)   Start     Dose/Rate Route Frequency Ordered Stop   08/06/18 0600  vancomycin (VANCOCIN) IVPB 1000 mg/200 mL premix     1,000 mg 200 mL/hr over 60 Minutes Intravenous Every 12 hours 08/05/18 2107     08/05/18 2115  vancomycin (VANCOCIN) IVPB 1000 mg/200 mL premix     1,000 mg 200 mL/hr over 60 Minutes Intravenous STAT 08/05/18 2105 08/05/18 2229   08/05/18 1630  vancomycin (VANCOCIN) IVPB 1000 mg/200 mL premix     1,000 mg 200 mL/hr over 60 Minutes Intravenous  Once 08/05/18 1622 08/05/18 1752      Assessment/Plan: s/p  Procedure(s): IRRIGATION AND DEBRIDEMENT AXILLARY ABSCESS (N/A) To OR this morning for debridement of recurrent right axillary abscess/ hidradenitis. I discussed with her the plan, risks including bleeding, infection, pain, scarring, recurrent disease. She understands she will have an open wound which will require several weeks of dressing changes.  LOS: 1 day    Berna Bue 08/06/2018

## 2018-08-06 NOTE — Transfer of Care (Signed)
Immediate Anesthesia Transfer of Care Note  Patient: Dana Morris  Procedure(s) Performed: IRRIGATION AND DEBRIDEMENT AXILLARY ABSCESS (N/A )  Patient Location: PACU  Anesthesia Type:General  Level of Consciousness: awake, alert  and oriented  Airway & Oxygen Therapy: Patient Spontanous Breathing and Patient connected to face mask oxygen  Post-op Assessment: Report given to RN and Post -op Vital signs reviewed and stable  Post vital signs: Reviewed and stable  Last Vitals:  Vitals Value Taken Time  BP 152/107 08/06/2018 11:28 AM  Temp 36.8 C 08/06/2018 11:28 AM  Pulse 96 08/06/2018 11:31 AM  Resp 20 08/06/2018 11:31 AM  SpO2 98 % 08/06/2018 11:31 AM  Vitals shown include unvalidated device data.  Last Pain:  Vitals:   08/06/18 0923  TempSrc:   PainSc: 10-Worst pain ever      Patients Stated Pain Goal: 4 (08/06/18 7741)  Complications: No apparent anesthesia complications

## 2018-08-07 ENCOUNTER — Encounter (HOSPITAL_COMMUNITY): Payer: Self-pay | Admitting: Surgery

## 2018-08-07 LAB — HIV ANTIBODY (ROUTINE TESTING W REFLEX): HIV Screen 4th Generation wRfx: NONREACTIVE

## 2018-08-07 MED ORDER — ENOXAPARIN SODIUM 40 MG/0.4ML ~~LOC~~ SOLN
40.0000 mg | SUBCUTANEOUS | Status: DC
  Administered 2018-08-07: 40 mg via SUBCUTANEOUS
  Filled 2018-08-07: qty 0.4

## 2018-08-07 MED ORDER — OXYCODONE HCL 5 MG PO TABS
5.0000 mg | ORAL_TABLET | ORAL | Status: DC | PRN
  Administered 2018-08-07 (×2): 10 mg via ORAL
  Administered 2018-08-07: 10:00:00 5 mg via ORAL
  Administered 2018-08-08: 05:00:00 10 mg via ORAL
  Filled 2018-08-07 (×3): qty 2
  Filled 2018-08-07: qty 1

## 2018-08-07 MED ORDER — IBUPROFEN 200 MG PO TABS
600.0000 mg | ORAL_TABLET | Freq: Four times a day (QID) | ORAL | Status: DC
  Administered 2018-08-07 – 2018-08-08 (×3): 600 mg via ORAL
  Filled 2018-08-07 (×3): qty 3

## 2018-08-07 MED ORDER — ACETAMINOPHEN 500 MG PO TABS
1000.0000 mg | ORAL_TABLET | Freq: Three times a day (TID) | ORAL | Status: DC
  Administered 2018-08-07: 1000 mg via ORAL
  Filled 2018-08-07 (×3): qty 2

## 2018-08-07 MED ORDER — VANCOMYCIN HCL 1000 MG IV SOLR
1000.0000 mg | Freq: Two times a day (BID) | INTRAVENOUS | Status: DC
  Administered 2018-08-07 – 2018-08-08 (×2): 1000 mg via INTRAVENOUS
  Filled 2018-08-07 (×3): qty 1000

## 2018-08-07 NOTE — Progress Notes (Signed)
1 Day Post-Op    CC: right axillary pain  Subjective: Pt having a lot of discomfort with the site, she can't really raise her shoulder right now after 4 mg IV morphine.    Objective: Vital signs in last 24 hours: Temp:  [98.1 F (36.7 Morris)-100.3 F (37.9 Morris)] 98.1 F (36.7 Morris) (05/12 0530) Pulse Rate:  [65-98] 75 (05/12 0530) Resp:  [15-22] 17 (05/12 0530) BP: (107-153)/(61-107) 120/90 (05/12 0530) SpO2:  [95 %-100 %] 100 % (05/12 0530) Weight:  [93 kg] 93 kg (05/11 0923) Last BM Date: 08/03/18(pt report last 3 days ago) 1240 PO 1000 IV 1800 urine Afebrile, VSS, BP up some No labs Intake/Output from previous day: 05/11 0701 - 05/12 0700 In: 2341 [P.O.:1240; I.V.:640; IV Piggyback:461] Out: 1810 [Urine:1800; Blood:10] Intake/Output this shift: No intake/output data recorded.  General appearance: alert, cooperative, mild distress and anxious and pain just moving her arm Skin: Skin color, texture, turgor normal. No rashes or lesions or No erythema around the excised portion, clean and some blood from dressing removal.  Repacked with Kerlix wet to dry,    Lab Results:  Recent Labs    08/05/18 1538  WBC 11.2*  HGB 14.5  HCT 42.5  PLT 282    BMET Recent Labs    08/05/18 1538  NA 138  K 3.2*  CL 100  CO2 24  GLUCOSE 109*  BUN 6  CREATININE 0.65  CALCIUM 9.6   PT/INR No results for input(s): LABPROT, INR in the last 72 hours.  No results for input(s): AST, ALT, ALKPHOS, BILITOT, PROT, ALBUMIN in the last 168 hours.   Lipase     Component Value Date/Time   LIPASE 17 10/27/2008 1240   Prior to Admission medications   Medication Sig Start Date End Date Taking? Authorizing Provider  acetaminophen (Dana Morris) 500 MG tablet Take 500 mg by mouth every 6 (six) hours as needed for pain.   Yes [provider]  doxycycline (VIBRAMYCIN) 100 MG capsule Take 1 capsule (100 mg total) by mouth 2 (two) times daily for 7 days. 08/02/18 08/09/18 Yes Dana Morris, Dana C,  PA-Morris  HYDROcodone-acetaminophen (NORCO/VICODIN) 5-325 MG tablet Take 1 tablet by mouth every 4 (four) hours as needed for severe pain. Patient not taking: Reported on 08/05/2018 08/02/18   Dana Morris C, PA-Morris   . vancomycin 1,000 mg (08/07/18 0453)   Medications: . acetaminophen  650 mg Oral Q6H  . gabapentin  300 mg Oral TID    Assessment/Plan Hx asthma  Hidradenitis with recurrent abscess incision and debridement of right axillary abscess/ hidradenitis, skin and subcutaneous tissue 8x5x2cm, 08/06/18, Dr. Phylliss Morris  FEN: Regular diet ID:  VAncomycin 5/10>> day 3 DVT: Lovenox added this AM/SCD Follow up:  Dr. Fredricka Morris POC: Dana Morris   [phone]  Dana Morris [phone]      Plan:  We did the first wet to dry dressing this AM.  As expected she had a lot of pain and could not even raise her arm to do the dressing change.  Picture above.  I will work on getting her to moving her shoulder, so we can get to the dressing.  Begin wet to dry dressing with shower of the site with each dressing change.       LOS: 2 days    Dana Morris 08/07/2018 [phone]

## 2018-08-08 MED ORDER — OXYCODONE HCL 5 MG PO TABS: 5.0000 mg | ORAL_TABLET | Freq: Four times a day (QID) | ORAL | 0 refills | Status: DC | PRN

## 2018-08-08 MED ORDER — ACETAMINOPHEN 500 MG PO TABS: ORAL_TABLET | ORAL | 0 refills | Status: DC

## 2018-08-08 MED ORDER — IBUPROFEN 200 MG PO TABS: ORAL_TABLET | ORAL | Status: DC

## 2018-08-08 NOTE — Discharge Instructions (Signed)
Hidradenitis Suppurativa Hidradenitis suppurativa is a long-term (chronic) skin disease. It is similar to a severe form of acne, but it affects areas of the body where acne would be unusual, especially areas of the body where skin rubs against skin and becomes moist. These include:  Underarms.  Groin.  Genital area.  Buttocks.  Upper thighs.  Breasts. Hidradenitis suppurativa may start out as small lumps or pimples caused by blocked sweat glands or hair follicles. Pimples may develop into deep sores that break open (rupture) and drain pus. Over time, affected areas of skin may thicken and become scarred. This condition is rare and does not spread from person to person (non-contagious). What are the causes? The exact cause of this condition is not known. It may be related to:  Female and female hormones.  An overactive disease-fighting system (immune system). The immune system may over-react to blocked hair follicles or sweat glands and cause swelling and pus-filled sores. What increases the risk? You are more likely to develop this condition if you:  Are female.  Are 32-26 years old.  Have a family history of hidradenitis suppurativa.  Have a personal history of acne.  Are overweight.  Smoke.  Take the medicine lithium. What are the signs or symptoms? The first symptoms are usually painful bumps in the skin, similar to pimples. The condition may get worse over time (progress), or it may only cause mild symptoms. If the disease progresses, symptoms may include:  Skin bumps getting bigger and growing deeper into the skin.  Bumps rupturing and draining pus.  Itchy, infected skin.  Skin getting thicker and scarred.  Tunnels under the skin (fistulas) where pus drains from a bump.  Pain during daily activities, such as pain during walking if your groin area is affected.  Emotional problems, such as stress or depression. This condition may affect your appearance and your  ability or willingness to wear certain clothes or do certain activities. How is this diagnosed? This condition is diagnosed by a health care provider who specializes in skin diseases (dermatologist). You may be diagnosed based on:  Your symptoms and medical history.  A physical exam.  Testing a pus sample for infection.  Blood tests. How is this treated? Your treatment will depend on how severe your symptoms are. The same treatment will not work for everybody with this condition. You may need to try several treatments to find what works best for you. Treatment may include:  Cleaning and bandaging (dressing) your wounds as needed.  Lifestyle changes, such as new skin care routines.  Taking medicines, such as: ? Antibiotics. ? Acne medicines. ? Medicines to reduce the activity of the immune system. ? A diabetes medicine (metformin). ? Birth control pills, for women. ? Steroids to reduce swelling and pain.  Working with a mental health care provider, if you experience emotional distress due to this condition. If you have severe symptoms that do not get better with medicine, you may need surgery. Surgery may involve:  Using a laser to clear the skin and remove hair follicles.  Opening and draining deep sores.  Removing the areas of skin that are diseased and scarred. Follow these instructions at home: Medicines   Take over-the-counter and prescription medicines only as told by your health care provider.  If you were prescribed an antibiotic medicine, take it as told by your health care provider. Do not stop taking the antibiotic even if your condition improves. Skin care  If you have open wounds, cover  them with a clean dressing as told by your health care provider. Keep wounds clean by washing them gently with soap and water when you bathe.  Do not shave the areas where you get hidradenitis suppurativa.  Do not wear deodorant.  Wear loose-fitting clothes.  Try to avoid  getting overheated or sweaty. If you get sweaty or wet, change into clean, dry clothes as soon as you can.  To help relieve pain and itchiness, cover sore areas with a warm, clean washcloth (warm compress) for 5-10 minutes as often as needed.  If told by your health care provider, take a bleach bath twice a week: ? Fill your bathtub halfway with water. ? Pour in  cup of unscented household bleach. ? Soak in the tub for 5-10 minutes. ? Only soak from the neck down. Avoid water on your face and hair. ? Shower to rinse off the bleach from your skin. General instructions  Learn as much as you can about your disease so that you have an active role in your treatment. Work closely with your health care provider to find treatments that work for you.  If you are overweight, work with your health care provider to lose weight as recommended.  Do not use any products that contain nicotine or tobacco, such as cigarettes and e-cigarettes. If you need help quitting, ask your health care provider.  If you struggle with living with this condition, talk with your health care provider or work with a mental health care provider as recommended.  Keep all follow-up visits as told by your health care provider. This is important. Where to find more information  Hidradenitis Suppurativa Foundation, Inc.: https://www.hs-foundation.org/ Contact a health care provider if you have:  A flare-up of hidradenitis suppurativa.  A fever or chills.  Trouble controlling your symptoms at home.  Trouble doing your daily activities because of your symptoms.  Trouble dealing with emotional problems related to your condition. Summary  Hidradenitis suppurativa is a long-term (chronic) skin disease. It is similar to a severe form of acne, but it affects areas of the body where acne would be unusual.  The first symptoms are usually painful bumps in the skin, similar to pimples. The condition may get worse over time  (progress), or it may only cause mild symptoms.  If you have open wounds, cover them with a clean dressing as told by your health care provider. Keep wounds clean by washing them gently with soap and water when you bathe.  Besides skin care, treatment may include medicines, laser treatment, and surgery. This information is not intended to replace advice given to you by your health care provider. Make sure you discuss any questions you have with your health care provider. Document Released: 10/27/2003 Document Revised: 03/22/2017 Document Reviewed: 03/22/2017 Elsevier Interactive Patient Education  2019 Elsevier Inc.   Mechanical Wound Debridement You can shower, get soap and water into the site.  Dry with a clean towel, and then redress, 1-2 times a day.  Twice a day is the best option.  CAll if you have new drainage or redness of the skin around the open site.    Mechanical wound debridement is a treatment to remove dead tissue from a wound. This helps the wound heal. The treatment involves cleaning the wound (irrigation) and using a pad or gauze (dressing) to remove dead tissue and debris from the wound. There are different types of mechanical wound debridement. Depending on the wound, you may need to repeat this procedure  or change to another form of debridement as your wound starts to heal. Tell a health care provider about:  Any allergies you have.  All medicines you are taking, including vitamins, herbs, eye drops, creams, and over-the-counter medicines.  Any blood disorders you have.  Any medical conditions you have, including any conditions that: ? Cause a significant decrease in blood circulation to the part of the body where the wound is, such as peripheral vascular disease. ? Compromise your defense (immune) system or white blood count.  Any surgeries you have had.  Whether you are pregnant or may be pregnant. What are the risks? Generally, this is a safe procedure.  However, problems may occur, including:  Infection.  Bleeding.  Damage to healthy tissue in and around your wound.  Soreness or pain.  Failure of the wound to heal.  Scarring. What happens before the procedure? You may be given antibiotic medicine to help prevent infection. What happens during the procedure?  Your health care provider may apply a numbing medicine (topical anesthetic) to the wound.  Your health care provider will irrigate your wound with a germ-free (sterile), salt-water (saline) solution. This removes debris, bacteria, and dead tissue.  Depending on what type of mechanical wound debridement you are having, your health care provider may do one of the following: ? Put a dressing on your wound. You may have dry gauze pad placed into the wound. Your health care provider will remove the gauze after the wound is dry. Any dead tissue and debris that has dried into the gauze will be lifted out of the wound (wet-to-dry debridement). ? Use a type of pad (monofilament fiber debridement pad). This pad has a fluffy surface on one side that picks up dead tissue and debris from your wound. Your health care provider wets the pad and wipes it over your wound for several minutes. ? Irrigate your wound with a pressurized stream of solution such as saline or water.  Once your health care provider is finished, he or she may apply a light dressing to your wound. The procedure may vary among health care providers and hospitals. What happens after the procedure?  You may receive medicine for pain.  You will continue to receive antibiotic medicine if it was started before your procedure. This information is not intended to replace advice given to you by your health care provider. Make sure you discuss any questions you have with your health care provider. Document Released: 12/03/2014 Document Revised: 08/20/2015 Document Reviewed: 07/23/2014 Elsevier Interactive Patient Education  2019  [employer]

## 2018-08-08 NOTE — TOC Transition Note (Signed)
Transition of Care [facility]) - CM/SW Discharge Note   Patient Details  Name: Dana Morris MRN: [id number] Date of Birth: July 24, 1985  [facility] CM/SW Contact:  Armanda Heritage, RN Phone Number: 08/08/2018, 12:07 PM   Clinical Narrative:    CM contacted charity agency for [facility] RN services.  Patient does not qualify based on criteria. Patient refuses HH services with out of pocket payments, states will manage care at home by herself.     Final next level of care: Home/Self Care Barriers to Discharge: No Barriers Identified   Patient Goals and CMS Choice Patient states their goals for this hospitalization and ongoing recovery are:: to go home      Discharge Placement     Home with self care           Discharge Plan and Services   Discharge Planning Services: CM Consult                      HH Arranged: Refused HH          Social Determinants of Health (SDOH) Interventions     Readmission Risk Interventions No flowsheet data found.

## 2018-08-08 NOTE — Discharge Summary (Signed)
Physician Discharge Summary  Patient ID: Dana Morris MRN: [id number] DOB/AGE: [birth year] 33 y.o.  Admit date: 08/05/2018 Discharge date: 08/08/2018  Admission Diagnoses:  Hidradenitis with recurrent abscess Hx asthma  Discharge Diagnoses:  Same Active Problems:   Right axillary hidradenitis   PROCEDURES: incision and debridement of right axillary abscess/ hidradenitis, skin and subcutaneous tissue 8x5x2cm, 08/06/2018 Dr. Gwenlyn Fudge Course:  The patient is a 33 year old black female who presents with pain and swelling in the right axilla that began about 4 days ago.  She denies any fevers or chills.  She denies any drainage from the area.  She has had hidradenitis problems for several years.  The pain and swelling got bad enough that she came to the emergency department today.  She was seen in the emergency department admitted by Dr. Carolynne Edouard.  She was taken the operating room in the a.m. 08/06/2018, and underwent the procedure as described above.  She tolerated the procedure well return to the floor.  Dressing was taken down on the first postoperative day and she was started on twice daily dressing changes.  This was extremely painful, and she can barely move her shoulder to reveal her axilla the first postop day.  We worked on her pain control and mobilizing her arm over the next 24 hours.  The wound looked quite good and she was ready for discharge on the second postoperative day.  She will go home with wet-to-dry dressings we asked home health to assist.  She does not have any insurance and case manager was going to work on that.  Condition on discharge: Improved.   POD #1   CBC Latest Ref Rng & Units 08/05/2018 01/12/2017 09/22/2012  WBC 4.0 - 10.5 K/uL 11.2(H) 6.7 -  Hemoglobin 12.0 - 15.0 g/dL 53.7 48.2 70.7  Hematocrit 36.0 - 46.0 % 42.5 41.7 43.0  Platelets 150 - 400 K/uL 282 264 -   CMP Latest Ref Rng & Units 08/05/2018 01/12/2017 09/22/2012  Glucose 70 - 99 mg/dL  867(J) 89 95  BUN 6 - 20 mg/dL 6 6 6   Creatinine 0.44 - 1.00 mg/dL 4.49 2.01 0.07  Sodium 135 - 145 mmol/L 138 134(L) 141  Potassium 3.5 - 5.1 mmol/L 3.2(L) 4.0 4.1  Chloride 98 - 111 mmol/L 100 104 102  CO2 22 - 32 mmol/L 24 22 -  Calcium 8.9 - 10.3 mg/dL 9.6 9.2 -  Total Protein 6.0 - 8.3 g/dL - - -  Total Bilirubin 0.3 - 1.2 mg/dL - - -  Alkaline Phos 39 - 117 U/L - - -  AST 0 - 37 U/L - - -  ALT 0 - 35 U/L - - -        Disposition: Home   Allergies as of 08/08/2018   No Known Allergies     Medication List    STOP taking these medications   doxycycline 100 MG capsule Commonly known as:  VIBRAMYCIN   HYDROcodone-acetaminophen 5-325 MG tablet Commonly known as:  NORCO/VICODIN     TAKE these medications   acetaminophen 500 MG tablet Commonly known as:  TYLENOL You can take 1000 mg every 8 hours for the first 2 to 3 days to help with the pain.  As the pain improves you can go back to using it as needed.  Do not take more than 4000 mg of Tylenol/acetaminophen per day it can harm your liver.  You can buy this over-the-counter at any drugstore. What changed:    how  much to take  how to take this  when to take this  reasons to take this  additional instructions   ibuprofen 200 MG tablet Commonly known as:  ADVIL You can take 2-3 tablets every 6 hours as needed for pain.  This is your second pain relief medication.  Do not exceed this amount it can harm your kidneys and cause an ulcer.  You can buy this over-the-counter at any drugstore.   oxyCODONE 5 MG immediate release tablet Commonly known as:  Oxy IR/ROXICODONE Take 1 tablet (5 mg total) by mouth every 6 (six) hours as needed for severe pain or breakthrough pain.      Follow-up Information    Berna Bueonnor, Chelsea A, MD Follow up on 08/29/2018.   Specialty:  General Surgery Why:  Your appointment is at 1:40 PM.  Be at the office 30 minutes early for check in.  Bring photo ID and insurance information.   Contact  information: [address] [place] [address] [phone]           Signed: Sherrie GeorgeJENNINGS,Makenzey Nanni 08/08/2018, 4:17 PM

## 2018-08-08 NOTE — Progress Notes (Signed)
2 Days Post-Op    CC: Right axillary pain/drainage  Subjective: She is doing much better this a.m.  We are going to get her in the shower and if her excision site looks good we will plan to discharge home.  Objective: Vital signs in last 24 hours: Temp:  [98.1 F (36.7 C)] 98.1 F (36.7 C) (05/13 0551) Pulse Rate:  [69-75] 75 (05/13 0551) Resp:  [16-18] 18 (05/13 0551) BP: (121-134)/(73-85) 129/73 (05/13 0551) SpO2:  [96 %-100 %] 98 % (05/13 0551) Last BM Date: 08/03/18(pt report last 3 days ago)  Intake/Output from previous day: 05/12 0701 - 05/13 0700 In: 1450 [P.O.:1200; IV Piggyback:250] Out: 2200 [Urine:2200] Intake/Output this shift: Total I/O In: 490 [P.O.:240; IV Piggyback:250] Out: 400 [Urine:400]  General appearance: alert, cooperative and no distress Skin:  Lab Results:  Recent Labs    08/05/18 1538  WBC 11.2*  HGB 14.5  HCT 42.5  PLT 282    BMET Recent Labs    08/05/18 1538  NA 138  K 3.2*  CL 100  CO2 24  GLUCOSE 109*  BUN 6  CREATININE 0.65  CALCIUM 9.6   PT/INR No results for input(s): LABPROT, INR in the last 72 hours.  No results for input(s): AST, ALT, ALKPHOS, BILITOT, PROT, ALBUMIN in the last 168 hours.   Lipase     Component Value Date/Time   LIPASE 17 10/27/2008 1240     Medications: . acetaminophen  1,000 mg Oral Q8H  . enoxaparin (LOVENOX) injection  40 mg Subcutaneous Q24H  . gabapentin  300 mg Oral TID  . ibuprofen  600 mg Oral Q6H    Assessment/Plan Hx asthma  Hidradenitis with recurrent abscess incision and debridement of right axillary abscess/ hidradenitis, skin and subcutaneous tissue 8x5x2cm, 08/06/18, Dr. Phylliss Blakes  FEN: Regular diet ID:  VAncomycin 5/10>> day 3 DVT: Lovenox added this AM/SCD Follow up:  Dr. Fredricka Bonine POC: Ewing, Kyra Searles   [phone]  Idamae Schuller [phone]      Plan: Site looks very good this morning.  Tolerated the dressing change fairly well.  Plan to  discharge home and follow-up in the office with Dr. Doylene Canard.     LOS: 3 days    Lisel Siegrist 08/08/2018 [phone]

## 2018-08-08 NOTE — Progress Notes (Signed)
Pt was discharged home today. Instructions were reviewed with patient, and questions were answered. Pt was taken to main entrance via wheelchair by NT.  

## 2018-08-11 LAB — AEROBIC/ANAEROBIC CULTURE W GRAM STAIN (SURGICAL/DEEP WOUND)

## 2018-08-11 LAB — AEROBIC/ANAEROBIC CULTURE (SURGICAL/DEEP WOUND)

## 2018-10-30 ENCOUNTER — Other Ambulatory Visit: Payer: Self-pay

## 2018-10-30 DIAGNOSIS — Z20822 Contact with and (suspected) exposure to covid-19: Secondary | ICD-10-CM

## 2018-10-31 LAB — NOVEL CORONAVIRUS, NAA: SARS-CoV-2, NAA: NOT DETECTED

## 2018-11-21 ENCOUNTER — Encounter (HOSPITAL_COMMUNITY): Payer: Self-pay

## 2018-11-21 ENCOUNTER — Emergency Department (HOSPITAL_COMMUNITY)
Admission: EM | Admit: 2018-11-21 | Discharge: 2018-11-21 | Disposition: A | Discharge status: Home / Self Care | Payer: Self-pay | Attending: Emergency Medicine | Admitting: Emergency Medicine

## 2018-11-21 ENCOUNTER — Other Ambulatory Visit: Payer: Self-pay

## 2018-11-21 DIAGNOSIS — R059 Cough, unspecified: Secondary | ICD-10-CM

## 2018-11-21 DIAGNOSIS — Z87891 Personal history of nicotine dependence: Secondary | ICD-10-CM | POA: Insufficient documentation

## 2018-11-21 DIAGNOSIS — J45909 Unspecified asthma, uncomplicated: Secondary | ICD-10-CM | POA: Insufficient documentation

## 2018-11-21 DIAGNOSIS — R05 Cough: Secondary | ICD-10-CM | POA: Insufficient documentation

## 2018-11-21 MED ORDER — ALBUTEROL SULFATE HFA 108 (90 BASE) MCG/ACT IN AERS
2.0000 | INHALATION_SPRAY | Freq: Once | RESPIRATORY_TRACT | Status: AC
  Administered 2018-11-21: 2 via RESPIRATORY_TRACT
  Filled 2018-11-21: qty 6.7

## 2018-11-21 MED ORDER — DEXAMETHASONE SODIUM PHOSPHATE 10 MG/ML IJ SOLN
10.0000 mg | Freq: Once | INTRAMUSCULAR | Status: AC
  Administered 2018-11-21: 10 mg via INTRAMUSCULAR
  Filled 2018-11-21: qty 1

## 2018-11-21 NOTE — ED Provider Notes (Addendum)
[facility] DEPT Provider Note   CSN: [id number] Arrival date & time: 11/21/18  0056     History   Chief Complaint Chief Complaint  Patient presents with   Cough    HPI Dana Morris is a 33 y.o. female.     HPI  This is a 33 year old female with a history of asthma who presents with cough.  Patient reports that she feels that her asthma and bronchitis is flaring.  It normally is worsened with change in weather.  She states that she has had a nonproductive cough.  She does not have an inhaler at home.  She denies any fevers.  She denies any significant chest pain or shortness of breath.  She feels she needs a breathing treatment.  She does smoke.  Past Medical History:  Diagnosis Date   Abscess    Asthma     Patient Active Problem List   Diagnosis Date Noted   Right axillary hidradenitis 08/05/2018    Past Surgical History:  Procedure Laterality Date   IRRIGATION AND DEBRIDEMENT ABSCESS Right 08/06/2018   Procedure: IRRIGATION AND DEBRIDEMENT AXILLARY ABSCESS;  Surgeon: Clovis Riley, MD;  Location: [facility];  Service: General;  Laterality: Right;     OB History   No obstetric history on file.      Home Medications    Prior to Admission medications   Medication Sig Start Date End Date Taking? Authorizing Provider  acetaminophen (TYLENOL) 500 MG tablet You can take 1000 mg every 8 hours for the first 2 to 3 days to help with the pain.  As the pain improves you can go back to using it as needed.  Do not take more than 4000 mg of Tylenol/acetaminophen per day it can harm your liver.  You can buy this over-the-counter at any drugstore. 08/08/18   Earnstine Regal, PA-C  ibuprofen (ADVIL) 200 MG tablet You can take 2-3 tablets every 6 hours as needed for pain.  This is your second pain relief medication.  Do not exceed this amount it can harm your kidneys and cause an ulcer.  You can buy this over-the-counter at any drugstore.  08/08/18   Earnstine Regal, PA-C  oxyCODONE (OXY IR/ROXICODONE) 5 MG immediate release tablet Take 1 tablet (5 mg total) by mouth every 6 (six) hours as needed for severe pain or breakthrough pain. 08/08/18   Earnstine Regal, PA-C    Family History Family History  Problem Relation Age of Onset   Hypertension Mother    Diabetes Maternal Uncle     Social History Social History   Tobacco Use   Smoking status: Former Smoker   Smokeless tobacco: Never Used  Substance Use Topics   Alcohol use: Not Currently    Comment: occasional    Drug use: Not Currently     Allergies   Patient has no known allergies.   Review of Systems Review of Systems  Constitutional: Negative for fever.  Respiratory: Positive for cough. Negative for shortness of breath.   Cardiovascular: Negative for chest pain.  Gastrointestinal: Negative for abdominal pain.  All other systems reviewed and are negative.    Physical Exam Updated Vital Signs BP (!) 130/95 (BP Location: Right Arm)    Pulse 74    Temp 98.2 F (36.8 C) (Oral)    Resp 16    Ht 1.702 m (5\' 7" )    Wt 93 kg    LMP 11/05/2018    SpO2 100%  BMI 32.11 kg/m   Physical Exam Vitals signs and nursing note reviewed.  Constitutional:      Appearance: She is well-developed. She is obese. She is not ill-appearing.  HENT:     Head: Normocephalic and atraumatic.  Eyes:     Pupils: Pupils are equal, round, and reactive to light.  Neck:     Musculoskeletal: Neck supple.  Cardiovascular:     Rate and Rhythm: Normal rate and regular rhythm.     Heart sounds: Normal heart sounds.  Pulmonary:     Effort: Pulmonary effort is normal. No respiratory distress.     Breath sounds: Normal breath sounds. No wheezing or rales.  Abdominal:     General: Bowel sounds are normal.     Palpations: Abdomen is soft.     Tenderness: There is no abdominal tenderness.     Hernia: No hernia is present.  Skin:    General: Skin is warm and dry.    Neurological:     Mental Status: She is alert and oriented to person, place, and time.  Psychiatric:        Mood and Affect: Mood normal.      ED Treatments / Results  Labs (all labs ordered are listed, but only abnormal results are displayed) Labs Reviewed - No data to display  EKG None  Radiology No results found.  Procedures Procedures (including critical care time)  Medications Ordered in ED Medications  albuterol (VENTOLIN HFA) 108 (90 Base) MCG/ACT inhaler 2 puff (has no administration in time range)  dexamethasone (DECADRON) injection 10 mg (has no administration in time range)     Initial Impression / Assessment and Plan / ED Course  I have reviewed the triage vital signs and the nursing notes.  Pertinent labs & imaging results that were available during my care of the patient were reviewed by me and considered in my medical decision making (see chart for details).        Patient presents with cough.  History of asthma.  Feels this is consistent with her prior asthma and bronchitis.  On physical exam, she is without wheezing.  Lung sounds are clear.  She is in no respiratory distress.  She is satting 100% on room air.  Patient was provided with an albuterol inhaler.  She was also given a dose of Decadron given her history of asthma.  Given her normal exam, do not feel she needs an x-ray.  Doubt pneumothorax or pneumonia.  After history, exam, and medical workup I feel the patient has been appropriately medically screened and is safe for discharge home. Pertinent diagnoses were discussed with the patient. Patient was given return precautions.   Final Clinical Impressions(s) / ED Diagnoses   Final diagnoses:  Cough    ED Discharge Orders    None       Riah Kehoe, Mayer Maskerourtney F, MD 11/21/18 0246    Shon BatonHorton, Sherlie Boyum F, MD 11/21/18 [phone]

## 2018-11-21 NOTE — ED Triage Notes (Signed)
Pt complains of having bronchitis again, she states that she started to have a cough with clear mucus this week, pt denies fevers

## 2018-11-21 NOTE — Discharge Instructions (Addendum)
You were seen today for cough.  You feel this consistent with your asthma.  Use inhaler as needed every 4 hours.

## 2019-06-17 ENCOUNTER — Emergency Department (HOSPITAL_COMMUNITY): Payer: Self-pay

## 2019-06-17 ENCOUNTER — Emergency Department (HOSPITAL_COMMUNITY)
Admission: EM | Admit: 2019-06-17 | Discharge: 2019-06-17 | Disposition: A | Discharge status: Home / Self Care | Payer: Self-pay | Attending: Emergency Medicine | Admitting: Emergency Medicine

## 2019-06-17 ENCOUNTER — Encounter (HOSPITAL_COMMUNITY): Payer: Self-pay | Admitting: Emergency Medicine

## 2019-06-17 ENCOUNTER — Other Ambulatory Visit: Payer: Self-pay

## 2019-06-17 DIAGNOSIS — Y929 Unspecified place or not applicable: Secondary | ICD-10-CM | POA: Insufficient documentation

## 2019-06-17 DIAGNOSIS — J45909 Unspecified asthma, uncomplicated: Secondary | ICD-10-CM | POA: Insufficient documentation

## 2019-06-17 DIAGNOSIS — Y9372 Activity, wrestling: Secondary | ICD-10-CM | POA: Insufficient documentation

## 2019-06-17 DIAGNOSIS — Z87891 Personal history of nicotine dependence: Secondary | ICD-10-CM | POA: Insufficient documentation

## 2019-06-17 DIAGNOSIS — S62524A Nondisplaced fracture of distal phalanx of right thumb, initial encounter for closed fracture: Secondary | ICD-10-CM | POA: Insufficient documentation

## 2019-06-17 DIAGNOSIS — W010XXA Fall on same level from slipping, tripping and stumbling without subsequent striking against object, initial encounter: Secondary | ICD-10-CM | POA: Insufficient documentation

## 2019-06-17 DIAGNOSIS — Y998 Other external cause status: Secondary | ICD-10-CM | POA: Insufficient documentation

## 2019-06-17 HISTORY — DX: Bronchitis, not specified as acute or chronic: J40

## 2019-06-17 NOTE — Discharge Instructions (Addendum)
You were seen in the emergency department for evaluation of a right thumb injury.  You have a fracture of your distal phalanx or bone in the thumb.  This is treated with a splint.  Please contact Dr. Bari Edward office for a follow-up this week.  Tylenol for pain.  Keep the splint on, clean and dry.

## 2019-06-17 NOTE — ED Provider Notes (Signed)
[facility] DEPT Provider Note   CSN: [id number] Arrival date & time: 06/17/19  1439     History Chief Complaint  Patient presents with  . Finger Injury    Dana Morris is a 34 y.o. female.  She is right-hand dominant.  She said she was wrestling with her brother yesterday and she fell to the ground injuring her right thumb.  She is complaining of moderate pain to that thumb increased with any type of movement.  No numbness.  Denies any other injuries or complaints.  The history is provided by the patient.  Hand Pain This is a new problem. The current episode started yesterday. The problem occurs constantly. The problem has not changed since onset.Pertinent negatives include no chest pain, no abdominal pain, no headaches and no shortness of breath. The symptoms are aggravated by bending. Nothing relieves the symptoms. She has tried nothing for the symptoms. The treatment provided no relief.       Past Medical History:  Diagnosis Date  . Abscess   . Asthma   . Bronchitis     Patient Active Problem List   Diagnosis Date Noted  . Right axillary hidradenitis 08/05/2018    Past Surgical History:  Procedure Laterality Date  . IRRIGATION AND DEBRIDEMENT ABSCESS Right 08/06/2018   Procedure: IRRIGATION AND DEBRIDEMENT AXILLARY ABSCESS;  Surgeon: Clovis Riley, MD;  Location: [facility];  Service: General;  Laterality: Right;     OB History   No obstetric history on file.     Family History  Problem Relation Age of Onset  . Hypertension Mother   . Diabetes Maternal Uncle     Social History   Tobacco Use  . Smoking status: Former Research scientist (life sciences)  . Smokeless tobacco: Never Used  Substance Use Topics  . Alcohol use: Not Currently    Comment: occasional   . Drug use: Not Currently    Home Medications Prior to Admission medications   Medication Sig Start Date End Date Taking? Authorizing Provider  acetaminophen (TYLENOL) 500 MG tablet You  can take 1000 mg every 8 hours for the first 2 to 3 days to help with the pain.  As the pain improves you can go back to using it as needed.  Do not take more than 4000 mg of Tylenol/acetaminophen per day it can harm your liver.  You can buy this over-the-counter at any drugstore. 08/08/18   Earnstine Regal, PA-C  ibuprofen (ADVIL) 200 MG tablet You can take 2-3 tablets every 6 hours as needed for pain.  This is your second pain relief medication.  Do not exceed this amount it can harm your kidneys and cause an ulcer.  You can buy this over-the-counter at any drugstore. 08/08/18   Earnstine Regal, PA-C  oxyCODONE (OXY IR/ROXICODONE) 5 MG immediate release tablet Take 1 tablet (5 mg total) by mouth every 6 (six) hours as needed for severe pain or breakthrough pain. 08/08/18   Earnstine Regal, PA-C    Allergies    Patient has no known allergies.  Review of Systems   Review of Systems  Respiratory: Negative for shortness of breath.   Cardiovascular: Negative for chest pain.  Gastrointestinal: Negative for abdominal pain.  Skin: Negative for wound.  Neurological: Negative for headaches.    Physical Exam Updated Vital Signs BP (!) 131/93   Pulse 68   Temp 98.4 F (36.9 C) (Oral)   Resp 16   Ht 5\' 7"  (1.702 m)   Wt 83.5  kg   SpO2 100%   BMI 28.82 kg/m   Physical Exam Constitutional:      Appearance: She is well-developed.  HENT:     Head: Normocephalic and atraumatic.  Eyes:     Conjunctiva/sclera: Conjunctivae normal.  Musculoskeletal:        General: Tenderness and signs of injury present.     Cervical back: Neck supple.     Right lower leg: No edema.     Left lower leg: No edema.     Comments: Nontender Right shoulder elbow and wrist.  Diffusely tender thumb from her MCP distally through her IP.  There is some overlying bruising.  Cap refill and sensory brisk distally.  Other digits nontender full range of motion.  Skin:    General: Skin is warm and dry.     Capillary  Refill: Capillary refill takes less than 2 seconds.  Neurological:     General: No focal deficit present.     Mental Status: She is alert.     GCS: GCS eye subscore is 4. GCS verbal subscore is 5. GCS motor subscore is 6.     ED Results / Procedures / Treatments   Labs (all labs ordered are listed, but only abnormal results are displayed) Labs Reviewed - No data to display  EKG None  Radiology DG Finger Thumb Right  Result Date: 06/17/2019 CLINICAL DATA:  Recent fall on outstretched hand with thumb pain, initial encounter EXAM: RIGHT THUMB 2+V COMPARISON:  None. FINDINGS: There is a transverse fracture through the base of first distal phalanx without significant displacement. Mild soft tissue swelling is noted. IMPRESSION: Fracture of the first distal phalanx without significant displacement. Electronically Signed   By: Alcide Clever M.D.   On: 06/17/2019 15:19    Procedures Procedures (including critical care time)  Medications Ordered in ED Medications - No data to display  ED Course  I have reviewed the triage vital signs and the nursing notes.  Pertinent labs & imaging results that were available during my care of the patient were reviewed by me and considered in my medical decision making (see chart for details).  Clinical Course as of Jun 16 1912  Mon Jun 17, 2019  1523 Fracture, dislocation, contusion, sprain   [MB]  1524 X-ray showing a fracture of the distal phalanx base.  Discussed with my Lin Givens PA and consultant who recommended thumb spica and follow-up with Dr. Orlan Leavens this week   [MB]  1610 [facility] here to place patient in thumb spica splint.  Normal CSMs after application.   [MB]    Clinical Course User Index [MB] Terrilee Files, MD   MDM Rules/Calculators/A&P                       Final Clinical Impression(s) / ED Diagnoses Final diagnoses:  Closed nondisplaced fracture of distal phalanx of right thumb, initial encounter    Rx / DC Orders ED  Discharge Orders    None       Terrilee Files, MD 06/17/19 [phone]

## 2019-06-17 NOTE — ED Triage Notes (Signed)
While wrestling with her brother, the patient injured her finger. She reports her finger was pushed backward when she fell.

## 2019-10-19 ENCOUNTER — Emergency Department (HOSPITAL_COMMUNITY)
Admission: EM | Admit: 2019-10-19 | Discharge: 2019-10-20 | Disposition: A | Discharge status: Home / Self Care | Payer: Self-pay | Attending: Emergency Medicine | Admitting: Emergency Medicine

## 2019-10-19 ENCOUNTER — Encounter (HOSPITAL_COMMUNITY): Payer: Self-pay

## 2019-10-19 DIAGNOSIS — H5789 Other specified disorders of eye and adnexa: Secondary | ICD-10-CM | POA: Insufficient documentation

## 2019-10-19 DIAGNOSIS — H5712 Ocular pain, left eye: Secondary | ICD-10-CM | POA: Insufficient documentation

## 2019-10-19 DIAGNOSIS — Z5321 Procedure and treatment not carried out due to patient leaving prior to being seen by health care provider: Secondary | ICD-10-CM | POA: Insufficient documentation

## 2019-10-19 MED ORDER — FLUORESCEIN SODIUM 1 MG OP STRP: 1.0000 | ORAL_STRIP | Freq: Once | OPHTHALMIC | Status: DC

## 2019-10-19 MED ORDER — TETRACAINE HCL 0.5 % OP SOLN: 2.0000 [drp] | Freq: Once | OPHTHALMIC | Status: DC

## 2019-10-19 NOTE — ED Triage Notes (Signed)
Pt to arrives to ED w/ c/o L eye pain that started yesterday. Pt denies injury trauma. Eye noted to be red in triage. 9/10 pain

## 2019-10-19 NOTE — ED Notes (Signed)
NA X2 

## 2019-10-19 NOTE — ED Notes (Signed)
Pt standing outside. 

## 2019-10-20 NOTE — ED Notes (Signed)
Called x3 w/ no asnwer.

## 2019-11-13 ENCOUNTER — Ambulatory Visit (HOSPITAL_COMMUNITY)
Admission: EM | Admit: 2019-11-13 | Discharge: 2019-11-13 | Disposition: A | Discharge status: Home / Self Care | Payer: Self-pay | Attending: Family Medicine | Admitting: Family Medicine

## 2019-11-13 ENCOUNTER — Encounter (HOSPITAL_COMMUNITY): Payer: Self-pay

## 2019-11-13 ENCOUNTER — Other Ambulatory Visit: Payer: Self-pay

## 2019-11-13 DIAGNOSIS — J4520 Mild intermittent asthma, uncomplicated: Secondary | ICD-10-CM

## 2019-11-13 MED ORDER — ALBUTEROL SULFATE HFA 108 (90 BASE) MCG/ACT IN AERS: 1.0000 | INHALATION_SPRAY | Freq: Four times a day (QID) | RESPIRATORY_TRACT | 1 refills | Status: DC | PRN

## 2019-11-13 NOTE — ED Triage Notes (Signed)
Pt needs med refill for albuterol inh. Pt requesting nebulizer machine and medicine for it also.

## 2019-11-13 NOTE — ED Provider Notes (Signed)
[facility]    CSN: [id number] Arrival date & time: 11/13/19  1634      History   Chief Complaint Chief Complaint  Patient presents with   medication refill    HPI Dana Morris is a 34 y.o. female.   Patient has a history of asthma and uses albuterol inhaler as needed.  She tells me frequency of use is only about once every 3 months.  Seems to be triggered by allergy.  She has had some cough productive of clear white sputum.  Denies any sinus pressure or pain.  HPI  Past Medical History:  Diagnosis Date   Abscess    Asthma    Bronchitis     Patient Active Problem List   Diagnosis Date Noted   Right axillary hidradenitis 08/05/2018    Past Surgical History:  Procedure Laterality Date   IRRIGATION AND DEBRIDEMENT ABSCESS Right 08/06/2018   Procedure: IRRIGATION AND DEBRIDEMENT AXILLARY ABSCESS;  Surgeon: Berna Bue, MD;  Location: [facility];  Service: General;  Laterality: Right;    OB History   No obstetric history on file.      Home Medications    Prior to Admission medications   Medication Sig Start Date End Date Taking? Authorizing Provider  acetaminophen (TYLENOL) 500 MG tablet You can take 1000 mg every 8 hours for the first 2 to 3 days to help with the pain.  As the pain improves you can go back to using it as needed.  Do not take more than 4000 mg of Tylenol/acetaminophen per day it can harm your liver.  You can buy this over-the-counter at any drugstore. 08/08/18   Sherrie George, PA-C  ibuprofen (ADVIL) 200 MG tablet You can take 2-3 tablets every 6 hours as needed for pain.  This is your second pain relief medication.  Do not exceed this amount it can harm your kidneys and cause an ulcer.  You can buy this over-the-counter at any drugstore. 08/08/18   Sherrie George, PA-C  oxyCODONE (OXY IR/ROXICODONE) 5 MG immediate release tablet Take 1 tablet (5 mg total) by mouth every 6 (six) hours as needed for severe pain or breakthrough  pain. 08/08/18   Sherrie George, PA-C    Family History Family History  Problem Relation Age of Onset   Hypertension Mother    Diabetes Maternal Uncle     Social History Social History   Tobacco Use   Smoking status: Former Smoker   Smokeless tobacco: Never Used  Substance Use Topics   Alcohol use: Not Currently    Comment: occasional    Drug use: Not Currently     Allergies   Patient has no known allergies.   Review of Systems Review of Systems  Constitutional: Negative.   Respiratory: Positive for cough and wheezing.   All other systems reviewed and are negative.    Physical Exam Triage Vital Signs ED Triage Vitals  Enc Vitals Group     BP 11/13/19 1748 126/90     Pulse Rate 11/13/19 1748 89     Resp 11/13/19 1748 16     Temp 11/13/19 1748 98.1 F (36.7 C)     Temp Source 11/13/19 1748 Oral     SpO2 11/13/19 1748 100 %     Weight 11/13/19 1749 186 lb (84.4 kg)     Height 11/13/19 1749 5\' 7"  (1.702 m)     Head Circumference --      Peak Flow --  Pain Score 11/13/19 1749 0     Pain Loc --      Pain Edu? --      Excl. in GC? --    No data found.  Updated Vital Signs BP 126/90    Pulse 89    Temp 98.1 F (36.7 C) (Oral)    Resp 16    Ht 5\' 7"  (1.702 m)    Wt 84.4 kg    SpO2 100%    BMI 29.13 kg/m   Visual Acuity Right Eye Distance:   Left Eye Distance:   Bilateral Distance:    Right Eye Near:   Left Eye Near:    Bilateral Near:     Physical Exam Vitals and nursing note reviewed.  Constitutional:      Appearance: Normal appearance.  HENT:     Right Ear: Tympanic membrane normal.     Left Ear: Tympanic membrane normal.     Nose: Nose normal.     Mouth/Throat:     Mouth: Mucous membranes are moist.  Cardiovascular:     Rate and Rhythm: Normal rate.  Pulmonary:     Effort: Pulmonary effort is normal.     Breath sounds: Normal breath sounds. No wheezing.  Neurological:     Mental Status: She is alert.      UC Treatments  / Results  Labs (all labs ordered are listed, but only abnormal results are displayed) Labs Reviewed - No data to display  EKG   Radiology No results found.  Procedures Procedures (including critical care time)  Medications Ordered in UC Medications - No data to display  Initial Impression / Assessment and Plan / UC Course  I have reviewed the triage vital signs and the nursing notes.  Pertinent labs & imaging results that were available during my care of the patient were reviewed by me and considered in my medical decision making (see chart for details).     Asthma, mild intermittent.  Will refill albuterol and continue to use it as needed Final Clinical Impressions(s) / UC Diagnoses   Final diagnoses:  None   Discharge Instructions   None    ED Prescriptions    None     PDMP not reviewed this encounter.   , MD 11/13/19 [phone]

## 2020-04-05 ENCOUNTER — Emergency Department (HOSPITAL_COMMUNITY): Payer: Self-pay

## 2020-04-05 ENCOUNTER — Encounter (HOSPITAL_COMMUNITY): Payer: Self-pay | Admitting: Emergency Medicine

## 2020-04-05 ENCOUNTER — Other Ambulatory Visit: Payer: Self-pay

## 2020-04-05 ENCOUNTER — Emergency Department (HOSPITAL_COMMUNITY)
Admission: EM | Admit: 2020-04-05 | Discharge: 2020-04-05 | Disposition: A | Discharge status: Home / Self Care | Payer: Self-pay | Attending: Emergency Medicine | Admitting: Emergency Medicine

## 2020-04-05 DIAGNOSIS — S069X9A Unspecified intracranial injury with loss of consciousness of unspecified duration, initial encounter: Secondary | ICD-10-CM | POA: Insufficient documentation

## 2020-04-05 DIAGNOSIS — M25572 Pain in left ankle and joints of left foot: Secondary | ICD-10-CM | POA: Insufficient documentation

## 2020-04-05 DIAGNOSIS — Z5321 Procedure and treatment not carried out due to patient leaving prior to being seen by health care provider: Secondary | ICD-10-CM | POA: Insufficient documentation

## 2020-04-05 DIAGNOSIS — S0181XA Laceration without foreign body of other part of head, initial encounter: Secondary | ICD-10-CM | POA: Insufficient documentation

## 2020-04-05 DIAGNOSIS — Y9334 Activity, bungee jumping: Secondary | ICD-10-CM | POA: Insufficient documentation

## 2020-04-05 NOTE — ED Notes (Signed)
Pt called 3x for vitals no response  
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
## 2020-05-12 ENCOUNTER — Emergency Department (HOSPITAL_COMMUNITY)
Admission: EM | Admit: 2020-05-12 | Discharge: 2020-05-12 | Disposition: A | Discharge status: Home / Self Care | Payer: Self-pay | Attending: Emergency Medicine | Admitting: Emergency Medicine

## 2020-05-12 ENCOUNTER — Other Ambulatory Visit: Payer: Self-pay

## 2020-05-12 ENCOUNTER — Encounter (HOSPITAL_COMMUNITY): Payer: Self-pay | Admitting: Emergency Medicine

## 2020-05-12 ENCOUNTER — Emergency Department (HOSPITAL_COMMUNITY): Payer: Self-pay

## 2020-05-12 DIAGNOSIS — M25511 Pain in right shoulder: Secondary | ICD-10-CM | POA: Insufficient documentation

## 2020-05-12 DIAGNOSIS — F17200 Nicotine dependence, unspecified, uncomplicated: Secondary | ICD-10-CM | POA: Insufficient documentation

## 2020-05-12 DIAGNOSIS — R0789 Other chest pain: Secondary | ICD-10-CM | POA: Insufficient documentation

## 2020-05-12 DIAGNOSIS — J45909 Unspecified asthma, uncomplicated: Secondary | ICD-10-CM | POA: Insufficient documentation

## 2020-05-12 DIAGNOSIS — M62830 Muscle spasm of back: Secondary | ICD-10-CM | POA: Insufficient documentation

## 2020-05-12 LAB — BASIC METABOLIC PANEL
Anion gap: 8 (ref 5–15)
BUN: 7 mg/dL (ref 6–20)
CO2: 26 mmol/L (ref 22–32)
Calcium: 9.2 mg/dL (ref 8.9–10.3)
Chloride: 106 mmol/L (ref 98–111)
Creatinine, Ser: 0.9 mg/dL (ref 0.44–1.00)
GFR, Estimated: >60 mL/min (ref >60)
Glucose, Bld: 79 mg/dL (ref 70–99)
Potassium: 3.7 mmol/L (ref 3.5–5.1)
Sodium: 140 mmol/L (ref 135–145)

## 2020-05-12 LAB — CBC
HCT: 41.9 % (ref 36.0–46.0)
Hemoglobin: 14.1 g/dL (ref 12.0–15.0)
MCH: 31.5 pg (ref 26.0–34.0)
MCHC: 33.7 g/dL (ref 30.0–36.0)
MCV: 93.7 fL (ref 80.0–100.0)
Platelets: 268 10*3/uL (ref 150–400)
RBC: 4.47 MIL/uL (ref 3.87–5.11)
RDW: 13.1 % (ref 11.5–15.5)
WBC: 6.3 10*3/uL (ref 4.0–10.5)
nRBC: 0 % (ref 0.0–0.2)

## 2020-05-12 LAB — I-STAT BETA HCG BLOOD, ED (NOT ORDERABLE): I-stat hCG, quantitative: <5 m[IU]/mL (ref <5)

## 2020-05-12 LAB — TROPONIN I (HIGH SENSITIVITY)
Troponin I (High Sensitivity): <2 ng/L (ref <18)
Troponin I (High Sensitivity): <2 ng/L (ref <18)

## 2020-05-12 MED ORDER — CYCLOBENZAPRINE HCL 10 MG PO TABS: 5.0000 mg | ORAL_TABLET | Freq: Two times a day (BID) | ORAL | 0 refills | Status: DC | PRN

## 2020-05-12 MED ORDER — MELOXICAM 15 MG PO TABS: 15.0000 mg | ORAL_TABLET | Freq: Every day | ORAL | 0 refills | Status: DC

## 2020-05-12 NOTE — Discharge Instructions (Addendum)
Contact a health care provider if: You have a fever. Your chest pain becomes worse. You have new symptoms. Get help right away if: You have nausea or vomiting. You feel sweaty or light-headed. You have a cough with mucus from your lungs (sputum) or you cough up blood. You develop shortness of breath. 

## 2020-05-12 NOTE — ED Provider Notes (Signed)
[facility]-EMERGENCY DEPT Provider Note   CSN: [id number] Arrival date & time: 05/12/20  1442     History Chief Complaint  Patient presents with  . Chest Pain    Dana Morris is a 35 y.o. female with no significant Past history who presents emergency department with chief complaint of right shoulder and upper chest pain.  Patient states that she began having pain in the right shoulder blade region and across the upper part of her chest wall last night.  She states that it is constant and aching but at times she has some sharp pains in the chest and between the shoulder blade in her spine and the back.  She is never had anything like this before.  It is worse when she moves her arm or changes position, better when she stays still or lies back.  She denies fevers, chills, shortness of breath, hemoptysis, history of blood clots, pleuritic chest pain, calf pain or swelling, recent confinement or surgery, use of exogenous estrogens or smoking.  HPI     Past Medical History:  Diagnosis Date  . Abscess   . Asthma   . Bronchitis     Patient Active Problem List   Diagnosis Date Noted  . Right axillary hidradenitis 08/05/2018    Past Surgical History:  Procedure Laterality Date  . IRRIGATION AND DEBRIDEMENT ABSCESS Right 08/06/2018   Procedure: IRRIGATION AND DEBRIDEMENT AXILLARY ABSCESS;  Surgeon: Berna Bue, MD;  Location: [facility];  Service: General;  Laterality: Right;     OB History   No obstetric history on file.     Family History  Problem Relation Age of Onset  . Hypertension Mother   . Diabetes Maternal Uncle     Social History   Tobacco Use  . Smoking status: Current Every Day Smoker  . Smokeless tobacco: Never Used  Substance Use Topics  . Alcohol use: Not Currently    Comment: occasional   . Drug use: Not Currently    Home Medications Prior to Admission medications   Medication Sig Start Date End Date Taking? Authorizing  Provider  acetaminophen (TYLENOL) 500 MG tablet You can take 1000 mg every 8 hours for the first 2 to 3 days to help with the pain.  As the pain improves you can go back to using it as needed.  Do not take more than 4000 mg of Tylenol/acetaminophen per day it can harm your liver.  You can buy this over-the-counter at any drugstore. 08/08/18   Sherrie George, PA-C  albuterol (VENTOLIN HFA) 108 (90 Base) MCG/ACT inhaler Inhale 1-2 puffs into the lungs every 6 (six) hours as needed for wheezing or shortness of breath. 11/13/19   Frederica Kuster, MD  ibuprofen (ADVIL) 200 MG tablet You can take 2-3 tablets every 6 hours as needed for pain.  This is your second pain relief medication.  Do not exceed this amount it can harm your kidneys and cause an ulcer.  You can buy this over-the-counter at any drugstore. 08/08/18   Sherrie George, PA-C  oxyCODONE (OXY IR/ROXICODONE) 5 MG immediate release tablet Take 1 tablet (5 mg total) by mouth every 6 (six) hours as needed for severe pain or breakthrough pain. 08/08/18   Sherrie George, PA-C    Allergies    Patient has no known allergies.  Review of Systems   Review of Systems Ten systems reviewed and are negative for acute change, except as noted in the HPI.   Physical  Exam Updated Vital Signs BP 130/76 (BP Location: Right Arm)   Pulse 70   Temp 98 F (36.7 C) (Oral)   Resp 17   SpO2 98%   Physical Exam Vitals and nursing note reviewed.  Constitutional:      General: She is not in acute distress.    Appearance: She is well-developed and well-nourished. She is not diaphoretic.  HENT:     Head: Normocephalic and atraumatic.  Eyes:     General: No scleral icterus.    Conjunctiva/sclera: Conjunctivae normal.  Cardiovascular:     Rate and Rhythm: Normal rate and regular rhythm.     Heart sounds: Normal heart sounds. No murmur heard. No friction rub. No gallop.   Pulmonary:     Effort: Pulmonary effort is normal. No respiratory distress.      Breath sounds: Normal breath sounds.  Chest:     Chest wall: Tenderness present.    Abdominal:     General: Bowel sounds are normal. There is no distension.     Palpations: Abdomen is soft. There is no mass.     Tenderness: There is no abdominal tenderness. There is no guarding.  Musculoskeletal:     Thoracic back: Spasms and tenderness present.       Back:  Skin:    General: Skin is warm and dry.  Neurological:     Mental Status: She is alert and oriented to person, place, and time.  Psychiatric:        Behavior: Behavior normal.     ED Results / Procedures / Treatments   Labs (all labs ordered are listed, but only abnormal results are displayed) Labs Reviewed  BASIC METABOLIC PANEL  CBC  I-STAT BETA HCG BLOOD, ED ([facility], [facility], [facility] ONLY)  I-STAT BETA HCG BLOOD, ED (NOT ORDERABLE)  TROPONIN I (HIGH SENSITIVITY)  TROPONIN I (HIGH SENSITIVITY)    EKG None  Radiology DG Chest 2 View  Result Date: 05/12/2020 CLINICAL DATA:  Chest pain EXAM: CHEST - 2 VIEW COMPARISON:  01/12/2017 FINDINGS: Lungs are clear.  No pleural effusion or pneumothorax. The heart is normal in size. Visualized osseous structures are within normal limits. IMPRESSION: Normal chest radiographs. Electronically Signed   By: Charline Bills M.D.   On: 05/12/2020 15:38    Procedures Procedures   Medications Ordered in ED Medications - No data to display  ED Course  I have reviewed the triage vital signs and the nursing notes.  Pertinent labs & imaging results that were available during my care of the patient were reviewed by me and considered in my medical decision making (see chart for details).    MDM Rules/Calculators/A&P                          35 year old female here with what appears to be chest wall pain.  She does not appear to have acute coronary syndrome based on risk factors, heart score of 1 and work-up with normal EKG, normal troponin.  I also ordered and reviewed a chest x-ray which  shows no acute abnormalities.  Patient CBC and chemistry panel are within normal limits.  Patient also is PERC negative.  Her exam clinical examination is consistent with spasm of the upper right shoulder blade region which is likely also causing spasm in the antagonist along the anterior chest wall.  Patient be discharged with symptomatic treatment, supportive care discussed and return precautions discussed.  She appears otherwise appropriate for discharge  at this time. Final Clinical Impression(s) / ED Diagnoses Final diagnoses:  None    Rx / DC Orders ED Discharge Orders    None       Arthor Captain, PA-C 05/12/20 2020    Gerhard Munch, MD 05/12/20 [id number]

## 2020-05-12 NOTE — ED Triage Notes (Signed)
States she started having CP radiating to right upper back-states symptoms started last night

## 2020-07-29 ENCOUNTER — Emergency Department (HOSPITAL_COMMUNITY)
Admission: EM | Admit: 2020-07-29 | Discharge: 2020-07-30 | Disposition: A | Discharge status: Home / Self Care | Payer: Self-pay | Attending: Emergency Medicine | Admitting: Emergency Medicine

## 2020-07-29 ENCOUNTER — Encounter (HOSPITAL_COMMUNITY): Payer: Self-pay

## 2020-07-29 ENCOUNTER — Other Ambulatory Visit: Payer: Self-pay

## 2020-07-29 DIAGNOSIS — L02413 Cutaneous abscess of right upper limb: Secondary | ICD-10-CM | POA: Insufficient documentation

## 2020-07-29 DIAGNOSIS — Z5321 Procedure and treatment not carried out due to patient leaving prior to being seen by health care provider: Secondary | ICD-10-CM | POA: Insufficient documentation

## 2020-07-29 NOTE — ED Triage Notes (Signed)
Pt reports abscess under right arm x 2 days.

## 2020-09-16 ENCOUNTER — Emergency Department (HOSPITAL_COMMUNITY)
Admission: EM | Admit: 2020-09-16 | Discharge: 2020-09-16 | Disposition: A | Discharge status: Home / Self Care | Payer: No Typology Code available for payment source | Attending: Emergency Medicine | Admitting: Emergency Medicine

## 2020-09-16 ENCOUNTER — Other Ambulatory Visit: Payer: Self-pay

## 2020-09-16 ENCOUNTER — Emergency Department (HOSPITAL_COMMUNITY): Payer: No Typology Code available for payment source

## 2020-09-16 DIAGNOSIS — T1490XA Injury, unspecified, initial encounter: Secondary | ICD-10-CM

## 2020-09-16 DIAGNOSIS — S0990XA Unspecified injury of head, initial encounter: Secondary | ICD-10-CM | POA: Diagnosis present

## 2020-09-16 DIAGNOSIS — Y92410 Unspecified street and highway as the place of occurrence of the external cause: Secondary | ICD-10-CM | POA: Diagnosis not present

## 2020-09-16 DIAGNOSIS — Y907 Blood alcohol level of 200-239 mg/100 ml: Secondary | ICD-10-CM | POA: Diagnosis not present

## 2020-09-16 DIAGNOSIS — R000 Tachycardia, unspecified: Secondary | ICD-10-CM | POA: Insufficient documentation

## 2020-09-16 DIAGNOSIS — R0602 Shortness of breath: Secondary | ICD-10-CM | POA: Insufficient documentation

## 2020-09-16 DIAGNOSIS — Z23 Encounter for immunization: Secondary | ICD-10-CM | POA: Insufficient documentation

## 2020-09-16 DIAGNOSIS — Z20822 Contact with and (suspected) exposure to covid-19: Secondary | ICD-10-CM | POA: Diagnosis not present

## 2020-09-16 DIAGNOSIS — S01512A Laceration without foreign body of oral cavity, initial encounter: Secondary | ICD-10-CM | POA: Diagnosis not present

## 2020-09-16 LAB — RESP PANEL BY RT-PCR (FLU A&B, COVID) ARPGX2
Influenza A by PCR: NEGATIVE
Influenza B by PCR: NEGATIVE
SARS Coronavirus 2 by RT PCR: NEGATIVE

## 2020-09-16 LAB — COMPREHENSIVE METABOLIC PANEL
ALT: 16 U/L (ref 0–44)
AST: 19 U/L (ref 15–41)
Albumin: 4.1 g/dL (ref 3.5–5.0)
Alkaline Phosphatase: 52 U/L (ref 38–126)
Anion gap: 14 (ref 5–15)
BUN: <5 mg/dL — ABNORMAL LOW (ref 6–20)
CO2: 22 mmol/L (ref 22–32)
Calcium: 9.4 mg/dL (ref 8.9–10.3)
Chloride: 105 mmol/L (ref 98–111)
Creatinine, Ser: 0.75 mg/dL (ref 0.44–1.00)
GFR, Estimated: >60 mL/min (ref >60)
Glucose, Bld: 103 mg/dL — ABNORMAL HIGH (ref 70–99)
Potassium: 3.1 mmol/L — ABNORMAL LOW (ref 3.5–5.1)
Sodium: 141 mmol/L (ref 135–145)
Total Bilirubin: 0.4 mg/dL (ref 0.3–1.2)
Total Protein: 6.9 g/dL (ref 6.5–8.1)

## 2020-09-16 LAB — CBC
HCT: 40.8 % (ref 36.0–46.0)
Hemoglobin: 13.9 g/dL (ref 12.0–15.0)
MCH: 31.2 pg (ref 26.0–34.0)
MCHC: 34.1 g/dL (ref 30.0–36.0)
MCV: 91.5 fL (ref 80.0–100.0)
Platelets: 339 10*3/uL (ref 150–400)
RBC: 4.46 MIL/uL (ref 3.87–5.11)
RDW: 13.4 % (ref 11.5–15.5)
WBC: 8.3 10*3/uL (ref 4.0–10.5)
nRBC: 0 % (ref 0.0–0.2)

## 2020-09-16 LAB — SAMPLE TO BLOOD BANK

## 2020-09-16 LAB — ETHANOL: Alcohol, Ethyl (B): 205 mg/dL — ABNORMAL HIGH (ref <10)

## 2020-09-16 LAB — I-STAT CHEM 8, ED
BUN: 3 mg/dL — ABNORMAL LOW (ref 6–20)
Calcium, Ion: 1.01 mmol/L — ABNORMAL LOW (ref 1.15–1.40)
Chloride: 109 mmol/L (ref 98–111)
Creatinine, Ser: 1.1 mg/dL — ABNORMAL HIGH (ref 0.44–1.00)
Glucose, Bld: 103 mg/dL — ABNORMAL HIGH (ref 70–99)
HCT: 41 % (ref 36.0–46.0)
Hemoglobin: 13.9 g/dL (ref 12.0–15.0)
Potassium: 3 mmol/L — ABNORMAL LOW (ref 3.5–5.1)
Sodium: 145 mmol/L (ref 135–145)
TCO2: 22 mmol/L (ref 22–32)

## 2020-09-16 LAB — I-STAT BETA HCG BLOOD, ED (MC, WL, AP ONLY): I-stat hCG, quantitative: <5 m[IU]/mL (ref <5)

## 2020-09-16 LAB — LACTIC ACID, PLASMA: Lactic Acid, Venous: 2.7 mmol/L (ref 0.5–1.9)

## 2020-09-16 LAB — PROTIME-INR
INR: 1 (ref 0.8–1.2)
Prothrombin Time: 13 seconds (ref 11.4–15.2)

## 2020-09-16 MED ORDER — IBUPROFEN 600 MG PO TABS: 600.0000 mg | ORAL_TABLET | Freq: Four times a day (QID) | ORAL | 0 refills | Status: DC | PRN

## 2020-09-16 MED ORDER — KETAMINE HCL 10 MG/ML IJ SOLN
INTRAMUSCULAR | Status: AC | PRN
  Administered 2020-09-16: 20 mg via INTRAVENOUS

## 2020-09-16 MED ORDER — ONDANSETRON HCL 4 MG/2ML IJ SOLN
4.0000 mg | Freq: Once | INTRAMUSCULAR | Status: AC
  Administered 2020-09-16: 4 mg via INTRAVENOUS
  Filled 2020-09-16: qty 2

## 2020-09-16 MED ORDER — LIDOCAINE-EPINEPHRINE (PF) 2 %-1:200000 IJ SOLN
20.0000 mL | Freq: Once | INTRAMUSCULAR | Status: AC
  Administered 2020-09-16: 20 mL
  Filled 2020-09-16: qty 20

## 2020-09-16 MED ORDER — KETAMINE HCL 50 MG/5ML IJ SOSY
1.0000 mg/kg | PREFILLED_SYRINGE | Freq: Once | INTRAMUSCULAR | Status: DC
  Filled 2020-09-16: qty 10

## 2020-09-16 MED ORDER — MORPHINE SULFATE (PF) 4 MG/ML IV SOLN
4.0000 mg | Freq: Once | INTRAVENOUS | Status: AC
  Administered 2020-09-16: 4 mg via INTRAVENOUS
  Filled 2020-09-16: qty 1

## 2020-09-16 MED ORDER — KETAMINE HCL 10 MG/ML IJ SOLN
INTRAMUSCULAR | Status: AC | PRN
  Administered 2020-09-16: 50 mg via INTRAVENOUS

## 2020-09-16 MED ORDER — TETANUS-DIPHTH-ACELL PERTUSSIS 5-2.5-18.5 LF-MCG/0.5 IM SUSY
0.5000 mL | PREFILLED_SYRINGE | Freq: Once | INTRAMUSCULAR | Status: AC
  Administered 2020-09-16: 0.5 mL via INTRAMUSCULAR
  Filled 2020-09-16: qty 0.5

## 2020-09-16 NOTE — ED Notes (Signed)
Lactic acid of 2.7

## 2020-09-16 NOTE — ED Notes (Signed)
Patient ambulating independently, able to tolerate PO challenge.
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
## 2020-09-16 NOTE — ED Notes (Signed)
Patient transported to CT 
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
## 2021-01-21 ENCOUNTER — Inpatient Hospital Stay (HOSPITAL_COMMUNITY): Payer: Self-pay

## 2021-01-21 ENCOUNTER — Encounter (HOSPITAL_COMMUNITY): Payer: Self-pay | Admitting: Obstetrics & Gynecology

## 2021-01-21 ENCOUNTER — Other Ambulatory Visit: Payer: Self-pay

## 2021-01-21 ENCOUNTER — Inpatient Hospital Stay (HOSPITAL_COMMUNITY)
Admission: AD | Admit: 2021-01-21 | Discharge: 2021-01-21 | Disposition: A | Discharge status: Home / Self Care | Payer: Self-pay | Attending: Obstetrics & Gynecology | Admitting: Obstetrics & Gynecology

## 2021-01-21 DIAGNOSIS — O3680X0 Pregnancy with inconclusive fetal viability, not applicable or unspecified: Secondary | ICD-10-CM | POA: Insufficient documentation

## 2021-01-21 DIAGNOSIS — O99331 Smoking (tobacco) complicating pregnancy, first trimester: Secondary | ICD-10-CM | POA: Insufficient documentation

## 2021-01-21 DIAGNOSIS — R109 Unspecified abdominal pain: Secondary | ICD-10-CM | POA: Insufficient documentation

## 2021-01-21 DIAGNOSIS — Z3A01 Less than 8 weeks gestation of pregnancy: Secondary | ICD-10-CM | POA: Insufficient documentation

## 2021-01-21 DIAGNOSIS — F17210 Nicotine dependence, cigarettes, uncomplicated: Secondary | ICD-10-CM | POA: Insufficient documentation

## 2021-01-21 DIAGNOSIS — O26891 Other specified pregnancy related conditions, first trimester: Secondary | ICD-10-CM | POA: Insufficient documentation

## 2021-01-21 LAB — URINALYSIS, ROUTINE W REFLEX MICROSCOPIC
Bilirubin Urine: NEGATIVE
Glucose, UA: NEGATIVE mg/dL
Ketones, ur: NEGATIVE mg/dL
Leukocytes,Ua: NEGATIVE
Nitrite: NEGATIVE
Protein, ur: NEGATIVE mg/dL
Specific Gravity, Urine: 1.009 (ref 1.005–1.030)
pH: 5 (ref 5.0–8.0)

## 2021-01-21 LAB — COMPREHENSIVE METABOLIC PANEL
ALT: 31 U/L (ref 0–44)
AST: 20 U/L (ref 15–41)
Albumin: 3.8 g/dL (ref 3.5–5.0)
Alkaline Phosphatase: 57 U/L (ref 38–126)
Anion gap: 8 (ref 5–15)
BUN: <5 mg/dL — ABNORMAL LOW (ref 6–20)
CO2: 24 mmol/L (ref 22–32)
Calcium: 9.2 mg/dL (ref 8.9–10.3)
Chloride: 102 mmol/L (ref 98–111)
Creatinine, Ser: 0.75 mg/dL (ref 0.44–1.00)
GFR, Estimated: >60 mL/min (ref >60)
Glucose, Bld: 118 mg/dL — ABNORMAL HIGH (ref 70–99)
Potassium: 3.5 mmol/L (ref 3.5–5.1)
Sodium: 134 mmol/L — ABNORMAL LOW (ref 135–145)
Total Bilirubin: 0.6 mg/dL (ref 0.3–1.2)
Total Protein: 7 g/dL (ref 6.5–8.1)

## 2021-01-21 LAB — CBC
HCT: 40 % (ref 36.0–46.0)
Hemoglobin: 13.6 g/dL (ref 12.0–15.0)
MCH: 30.3 pg (ref 26.0–34.0)
MCHC: 34 g/dL (ref 30.0–36.0)
MCV: 89.1 fL (ref 80.0–100.0)
Platelets: 303 10*3/uL (ref 150–400)
RBC: 4.49 MIL/uL (ref 3.87–5.11)
RDW: 12.9 % (ref 11.5–15.5)
WBC: 5.5 10*3/uL (ref 4.0–10.5)
nRBC: 0 % (ref 0.0–0.2)

## 2021-01-21 LAB — WET PREP, GENITAL
Clue Cells Wet Prep HPF POC: NONE SEEN
Sperm: NONE SEEN
Trich, Wet Prep: NONE SEEN
Yeast Wet Prep HPF POC: NONE SEEN

## 2021-01-21 LAB — POCT PREGNANCY, URINE: Preg Test, Ur: POSITIVE — AB

## 2021-01-21 LAB — TYPE AND SCREEN
ABO/RH(D): O POS
Antibody Screen: NEGATIVE

## 2021-01-21 LAB — HCG, QUANTITATIVE, PREGNANCY: hCG, Beta Chain, Quant, S: 84 m[IU]/mL — ABNORMAL HIGH (ref <5)

## 2021-01-21 NOTE — MAU Provider Note (Signed)
History     [id number]  Arrival date and time: 01/21/21 1207    Chief Complaint  Patient presents with   Abdominal Pain     HPI Dana Morris is a 35 y.o. at [redacted]w[redacted]d by LMP, who presents for abdominal pain.   Patient presenting with two days of intermittent abdominal pain Not sharp but unable to describe exactly what the nature of the pain is Located in pelvic area bilaterally Denies vaginal bleeding or discharge No burning or pain with urination No nausea or vomiting No fevers No back pain Unplanned but welcome pregnancy  --/--/O POS (10/27 1253)  OB History     Gravida  1   Para      Term      Preterm      AB      Living         SAB      IAB      Ectopic      Multiple      Live Births              Past Medical History:  Diagnosis Date   Abscess    Asthma    Bronchitis     Past Surgical History:  Procedure Laterality Date   IRRIGATION AND DEBRIDEMENT ABSCESS Right 08/06/2018   Procedure: IRRIGATION AND DEBRIDEMENT AXILLARY ABSCESS;  Surgeon: Berna Bue, MD;  Location: [facility];  Service: General;  Laterality: Right;    Family History  Problem Relation Age of Onset   Hypertension Mother    Diabetes Maternal Uncle     Social History   Socioeconomic History   Marital status: Single    Spouse name: Not on file   Number of children: Not on file   Years of education: Not on file   Highest education level: Not on file  Occupational History   Not on file  Tobacco Use   Smoking status: Every Day   Smokeless tobacco: Never  Substance and Sexual Activity   Alcohol use: Not Currently    Comment: occasional    Drug use: Not Currently   Sexual activity: Yes    Birth control/protection: Condom  Other Topics Concern   Not on file  Social History Narrative   Not on file   Social Determinants of Health   Financial Resource Strain: Not on file  Food Insecurity: Not on file  Transportation Needs: Not on file  Physical  Activity: Not on file  Stress: Not on file  Social Connections: Not on file  Intimate Partner Violence: Not on file    No Known Allergies  No current facility-administered medications on file prior to encounter.   Current Outpatient Medications on File Prior to Encounter  Medication Sig Dispense Refill   acetaminophen (TYLENOL) 500 MG tablet You can take 1000 mg every 8 hours for the first 2 to 3 days to help with the pain.  As the pain improves you can go back to using it as needed.  Do not take more than 4000 mg of Tylenol/acetaminophen per day it can harm your liver.  You can buy this over-the-counter at any drugstore. 30 tablet 0   albuterol (VENTOLIN HFA) 108 (90 Base) MCG/ACT inhaler Inhale 1-2 puffs into the lungs every 6 (six) hours as needed for wheezing or shortness of breath. 6.7 g 1   cyclobenzaprine (FLEXERIL) 10 MG tablet Take 0.5-1 tablets (5-10 mg total) by mouth 2 (two) times daily as needed for muscle spasms. 20  tablet 0   ibuprofen (ADVIL) 200 MG tablet You can take 2-3 tablets every 6 hours as needed for pain.  This is your second pain relief medication.  Do not exceed this amount it can harm your kidneys and cause an ulcer.  You can buy this over-the-counter at any drugstore.     ibuprofen (ADVIL) 600 MG tablet Take 1 tablet (600 mg total) by mouth every 6 (six) hours as needed. 30 tablet 0   meloxicam (MOBIC) 15 MG tablet Take 1 tablet (15 mg total) by mouth daily. Take 1 daily with food. 10 tablet 0   oxyCODONE (OXY IR/ROXICODONE) 5 MG immediate release tablet Take 1 tablet (5 mg total) by mouth every 6 (six) hours as needed for severe pain or breakthrough pain. 20 tablet 0     ROS Pertinent positives and negative per HPI, all others reviewed and negative  Physical Exam   BP 120/74 (BP Location: Right Arm)   Pulse (!) 101   Temp 98.7 F (37.1 C) (Oral)   Resp 20   Ht 5\' 7"  (1.702 m)   Wt 93.1 kg   LMP 12/16/2020   SpO2 100%   BMI 32.14 kg/m   Patient  Vitals for the past 24 hrs:  BP Temp Temp src Pulse Resp SpO2 Height Weight  01/21/21 1220 120/74 98.7 F (37.1 C) Oral (!) 101 20 100 % -- --  01/21/21 1216 -- -- -- -- -- -- 5\' 7"  (1.702 m) 93.1 kg    Physical Exam Vitals reviewed.  Constitutional:      General: She is not in acute distress.    Appearance: She is well-developed. She is not diaphoretic.  Eyes:     General: No scleral icterus. Pulmonary:     Effort: Pulmonary effort is normal. No respiratory distress.  Abdominal:     General: There is no distension.     Palpations: Abdomen is soft.     Tenderness: There is no abdominal tenderness. There is no guarding or rebound.  Skin:    General: Skin is warm and dry.  Neurological:     Mental Status: She is alert.     Coordination: Coordination normal.     Cervical Exam    Bedside Ultrasound Not done  My interpretation: n/a  FHT N/a  Labs Results for orders placed or performed during the hospital encounter of 01/21/21 (from the past 24 hour(s))  Pregnancy, urine POC     Status: Abnormal   Collection Time: 01/21/21 12:37 PM  Result Value Ref Range   Preg Test, Ur POSITIVE (A) NEGATIVE  Urinalysis, Routine w reflex microscopic Urine, Clean Catch     Status: Abnormal   Collection Time: 01/21/21 12:41 PM  Result Value Ref Range   Color, Urine YELLOW YELLOW   APPearance HAZY (A) CLEAR   Specific Gravity, Urine 1.009 1.005 - 1.030   pH 5.0 5.0 - 8.0   Glucose, UA NEGATIVE NEGATIVE mg/dL   Hgb urine dipstick SMALL (A) NEGATIVE   Bilirubin Urine NEGATIVE NEGATIVE   Ketones, ur NEGATIVE NEGATIVE mg/dL   Protein, ur NEGATIVE NEGATIVE mg/dL   Nitrite NEGATIVE NEGATIVE   Leukocytes,Ua NEGATIVE NEGATIVE   RBC / HPF 6-10 0 - 5 RBC/hpf   WBC, UA 0-5 0 - 5 WBC/hpf   Bacteria, UA RARE (A) NONE SEEN   Squamous Epithelial / LPF 0-5 0 - 5  CBC     Status: None   Collection Time: 01/21/21 12:53 PM  Result Value Ref  Range   WBC 5.5 4.0 - 10.5 K/uL   RBC 4.49 3.87 - 5.11  MIL/uL   Hemoglobin 13.6 12.0 - 15.0 g/dL   HCT 92.4 26.8 - 34.1 %   MCV 89.1 80.0 - 100.0 fL   MCH 30.3 26.0 - 34.0 pg   MCHC 34.0 30.0 - 36.0 g/dL   RDW 96.2 22.9 - 79.8 %   Platelets 303 150 - 400 K/uL   nRBC 0.0 0.0 - 0.2 %  Comprehensive metabolic panel     Status: Abnormal   Collection Time: 01/21/21 12:53 PM  Result Value Ref Range   Sodium 134 (L) 135 - 145 mmol/L   Potassium 3.5 3.5 - 5.1 mmol/L   Chloride 102 98 - 111 mmol/L   CO2 24 22 - 32 mmol/L   Glucose, Bld 118 (H) 70 - 99 mg/dL   BUN <5 (L) 6 - 20 mg/dL   Creatinine, Ser 9.21 0.44 - 1.00 mg/dL   Calcium 9.2 8.9 - 19.4 mg/dL   Total Protein 7.0 6.5 - 8.1 g/dL   Albumin 3.8 3.5 - 5.0 g/dL   AST 20 15 - 41 U/L   ALT 31 0 - 44 U/L   Alkaline Phosphatase 57 38 - 126 U/L   Total Bilirubin 0.6 0.3 - 1.2 mg/dL   GFR, Estimated >17 >40 mL/min   Anion gap 8 5 - 15  hCG, quantitative, pregnancy     Status: Abnormal   Collection Time: 01/21/21 12:53 PM  Result Value Ref Range   hCG, Beta Chain, Quant, S 84 (H) <5 mIU/mL  Type and screen [facility]     Status: None   Collection Time: 01/21/21 12:53 PM  Result Value Ref Range   ABO/RH(D) O POS    Antibody Screen NEG    Sample Expiration      01/24/2021,2359 Performed at [facility], [address], [place], [place] [address]   Wet prep, genital     Status: Abnormal   Collection Time: 01/21/21  1:00 PM   Specimen: PATH Cytology Cervicovaginal Ancillary Only  Result Value Ref Range   Yeast Wet Prep HPF POC NONE SEEN NONE SEEN   Trich, Wet Prep NONE SEEN NONE SEEN   Clue Cells Wet Prep HPF POC NONE SEEN NONE SEEN   WBC, Wet Prep HPF POC FEW (A) NONE SEEN   Sperm NONE SEEN     Imaging US OB LESS THAN 14 WEEKS WITH OB TRANSVAGINAL  Result Date: 01/21/2021 CLINICAL DATA:  Lower abdominal pain for 2 days. Estimated gestational age of [redacted] weeks, 1 day by LMP. EXAM: OBSTETRIC <14 WK [place] AND TRANSVAGINAL OB US TECHNIQUE: Both transabdominal and  transvaginal ultrasound examinations were performed for complete evaluation of the gestation as well as the maternal uterus, adnexal regions, and pelvic cul-de-sac. Transvaginal technique was performed to assess early pregnancy. COMPARISON:  Pelvic ultrasound dated October 27, 2008. FINDINGS: Intrauterine gestational sac: None. Maternal uterus/adnexae: Unremarkable.  Right ovarian corpus luteum. IMPRESSION: 1. No IUP is visualized. By definition, in the setting of a positive pregnancy test, this reflects a pregnancy of unknown location. Differential considerations include early normal IUP, abnormal IUP/missed abortion, or nonvisualized ectopic pregnancy. Serial beta HCG is suggested. Consider repeat pelvic ultrasound in 14 days. Electronically Signed   By: Obie Dredge M.D.   On: 01/21/2021 13:38    MAU Course  Procedures Lab Orders         Wet prep, genital  Urinalysis, Routine w reflex microscopic Urine, Clean Catch         CBC         Comprehensive metabolic panel         hCG, quantitative, pregnancy         hCG, quantitative, pregnancy         Pregnancy, urine POC    No orders of the defined types were placed in this encounter.  Imaging Orders         US OB LESS THAN 14 WEEKS WITH OB TRANSVAGINAL     MDM moderate  Assessment and Plan  #Abdominal pain in pregnancy, first trimester #Pregnancy of unknown location HCG 84, no vaginal bleeding, unremarkable [place]. Discussed PUL, need to follow hcg to evaluate viability/normal rise. Discussed ectopic precautions in detail and that ruptured ectopic is a life threatening condition we need to rule out, to return for heavy vaginal bleeding, crescendo abdominal pain or fever. All questions answered. Future order placed for hcg.     Dispo: Discharged to home in stable condition with plan to return in 2 days for hcg check.   Venora Maples, MD/MPH 01/21/21 2:55 PM  Allergies as of 01/21/2021   No Known Allergies      Medication List      STOP taking these medications    ibuprofen 200 MG tablet Commonly known as: ADVIL   ibuprofen 600 MG tablet Commonly known as: ADVIL   meloxicam 15 MG tablet Commonly known as: Mobic   oxyCODONE 5 MG immediate release tablet Commonly known as: Oxy IR/ROXICODONE       TAKE these medications    acetaminophen 500 MG tablet Commonly known as: TYLENOL You can take 1000 mg every 8 hours for the first 2 to 3 days to help with the pain.  As the pain improves you can go back to using it as needed.  Do not take more than 4000 mg of Tylenol/acetaminophen per day it can harm your liver.  You can buy this over-the-counter at any drugstore.   albuterol 108 (90 Base) MCG/ACT inhaler Commonly known as: VENTOLIN HFA Inhale 1-2 puffs into the lungs every 6 (six) hours as needed for wheezing or shortness of breath.   cyclobenzaprine 10 MG tablet Commonly known as: FLEXERIL Take 0.5-1 tablets (5-10 mg total) by mouth 2 (two) times daily as needed for muscle spasms.

## 2021-01-21 NOTE — MAU Note (Signed)
Pt self swabbed

## 2021-01-21 NOTE — MAU Note (Addendum)
Presents c/o lower abdominal pain x2 days, reports pain is an intermittent ache.  Denies VB.  LMP 12/16/2020.  +HPT.

## 2021-01-22 LAB — GC/CHLAMYDIA PROBE AMP (~~LOC~~) NOT AT ARMC
Chlamydia: NEGATIVE
Comment: NEGATIVE
Comment: NORMAL
Neisseria Gonorrhea: NEGATIVE

## 2021-01-23 ENCOUNTER — Inpatient Hospital Stay (HOSPITAL_COMMUNITY)
Admission: AD | Admit: 2021-01-23 | Discharge: 2021-01-23 | Disposition: A | Discharge status: Home / Self Care | Payer: Self-pay | Attending: Obstetrics & Gynecology | Admitting: Obstetrics & Gynecology

## 2021-01-23 ENCOUNTER — Other Ambulatory Visit: Payer: Self-pay

## 2021-01-23 DIAGNOSIS — O26891 Other specified pregnancy related conditions, first trimester: Secondary | ICD-10-CM | POA: Insufficient documentation

## 2021-01-23 DIAGNOSIS — Z3A01 Less than 8 weeks gestation of pregnancy: Secondary | ICD-10-CM

## 2021-01-23 DIAGNOSIS — O3680X0 Pregnancy with inconclusive fetal viability, not applicable or unspecified: Secondary | ICD-10-CM

## 2021-01-23 LAB — HCG, QUANTITATIVE, PREGNANCY: hCG, Beta Chain, Quant, S: 92 m[IU]/mL — ABNORMAL HIGH (ref <5)

## 2021-01-23 NOTE — MAU Provider Note (Signed)
History   Chief Complaint:  Follow-up   Dana Morris is  35 y.o. G1P0 Patient's last menstrual period was 12/16/2020.Marland Kitchen Patient is here for follow up of quantitative HCG and ongoing surveillance of pregnancy status. She is [redacted]w[redacted]d weeks gestation  by LMP.    Since her last visit, the patient is without new complaint. The patient reports bleeding as  none now.  She denies any pain.  General ROS:  negative  Her previous Quantitative HCG values are: Results for Dana Morris, Dana Morris (MRN [id number]) as of 01/23/2021 16:19  Ref. Range 01/21/2021 12:53  HCG, Beta Chain, Quant, S Latest Ref Range: <5 mIU/mL 84 (H)    Physical Exam   Blood pressure 118/78, pulse 78, temperature 98.5 F (36.9 C), temperature source Oral, resp. rate 16, last menstrual period 12/16/2020, SpO2 100 %.  Physical Exam Vitals and nursing note reviewed.  Constitutional:      General: She is not in acute distress.    Appearance: She is well-developed.  HENT:     Head: Normocephalic.  Eyes:     Pupils: Pupils are equal, round, and reactive to light.  Cardiovascular:     Rate and Rhythm: Normal rate and regular rhythm.  Pulmonary:     Effort: Pulmonary effort is normal. No respiratory distress.     Breath sounds: Normal breath sounds.  Abdominal:     Palpations: Abdomen is soft.     Tenderness: There is no abdominal tenderness.  Musculoskeletal:        General: Normal range of motion.     Cervical back: Normal range of motion.  Skin:    General: Skin is warm and dry.  Neurological:     Mental Status: She is alert and oriented to person, place, and time.  Psychiatric:        Behavior: Behavior normal.        Thought Content: Thought content normal.        Judgment: Judgment normal.     Labs: Results for orders placed or performed during the hospital encounter of 01/23/21 (from the past 24 hour(s))  hCG, quantitative, pregnancy   Collection Time: 01/23/21  1:58 PM  Result Value Ref Range   hCG, Beta  Chain, Quant, S 92 (H) <5 mIU/mL    Consulted with Dr. Debroah Loop- ok to bring patient back in 48 hours for repeat HCG and strict ectopic precautions  Assessment:   1. Pregnancy of unknown anatomic location   2. [redacted] weeks gestation of pregnancy       Plan: -Discharge home in stable condition -Ectopic precautions discussed -Patient advised to follow-up with [facility] on Monday for repeat labs, appointment made -Patient may return to MAU as needed or if her condition were to change or worsen  Rolm Bookbinder, CNM 01/23/2021, 4:19 PM

## 2021-01-23 NOTE — MAU Note (Signed)
Pt reports to mau for follow up lab work.  Denies pain or bleeding today.

## 2021-01-23 NOTE — Discharge Instructions (Signed)

## 2021-01-25 ENCOUNTER — Ambulatory Visit (INDEPENDENT_AMBULATORY_CARE_PROVIDER_SITE_OTHER): Payer: Self-pay | Admitting: *Deleted

## 2021-01-25 ENCOUNTER — Other Ambulatory Visit: Payer: Self-pay

## 2021-01-25 VITALS — BP 111/80 | HR 84 | Ht 67.0 in | Wt 204.2 lb

## 2021-01-25 DIAGNOSIS — O3680X0 Pregnancy with inconclusive fetal viability, not applicable or unspecified: Secondary | ICD-10-CM

## 2021-01-25 DIAGNOSIS — O039 Complete or unspecified spontaneous abortion without complication: Secondary | ICD-10-CM

## 2021-01-25 LAB — BETA HCG QUANT (REF LAB): hCG Quant: 87 m[IU]/mL

## 2021-01-25 NOTE — Progress Notes (Signed)
Stat bhcg results received and reviewed with Dr. Alysia Penna. Called Dana Morris and reviewed bhcg =87 today with her. Apolgized for delay of receiving results and calling her. I informed her per Dr. Alysia Penna that her bhcg did not rise appropriately and may be miscarriage; especially since she is beginning to spot. Advised if miscarriage she should expect heavy bleeding with clots for a few days. Advised if she has severe pain or continues to have heavy bleeding she should go to [facility] MAU for evaluation. Also informed her he recommends non stat bhcg in 2 weeks. Appointment given. Support given. She voices understanding.  Fletcher Ostermiller,RN

## 2021-01-25 NOTE — Progress Notes (Signed)
Here for STAT bhcg.  Denies pain, is having a little spotting since last night- only when wipes., denies intercourse. Explained we will draw stat bhcg and have her leave office. She will be called with results in about 2 hours after results received and reviewed by provider. She voices understanding.   Halia Franey,RN

## 2021-01-26 ENCOUNTER — Encounter (HOSPITAL_COMMUNITY): Payer: Self-pay | Admitting: Obstetrics and Gynecology

## 2021-01-26 ENCOUNTER — Inpatient Hospital Stay (HOSPITAL_COMMUNITY)
Admission: AD | Admit: 2021-01-26 | Discharge: 2021-01-26 | Disposition: A | Discharge status: Home / Self Care | Payer: Self-pay | Attending: Obstetrics and Gynecology | Admitting: Obstetrics and Gynecology

## 2021-01-26 DIAGNOSIS — Z3A01 Less than 8 weeks gestation of pregnancy: Secondary | ICD-10-CM | POA: Insufficient documentation

## 2021-01-26 DIAGNOSIS — O209 Hemorrhage in early pregnancy, unspecified: Secondary | ICD-10-CM | POA: Insufficient documentation

## 2021-01-26 LAB — CBC
HCT: 36.8 % (ref 36.0–46.0)
Hemoglobin: 12.5 g/dL (ref 12.0–15.0)
MCH: 30.2 pg (ref 26.0–34.0)
MCHC: 34 g/dL (ref 30.0–36.0)
MCV: 88.9 fL (ref 80.0–100.0)
Platelets: 328 10*3/uL (ref 150–400)
RBC: 4.14 MIL/uL (ref 3.87–5.11)
RDW: 13 % (ref 11.5–15.5)
WBC: 8.3 10*3/uL (ref 4.0–10.5)
nRBC: 0 % (ref 0.0–0.2)

## 2021-01-26 MED ORDER — IBUPROFEN 600 MG PO TABS: 600.0000 mg | ORAL_TABLET | Freq: Four times a day (QID) | ORAL | 0 refills | Status: DC | PRN

## 2021-01-26 MED ORDER — ACETAMINOPHEN-CODEINE #3 300-30 MG PO TABS: 1.0000 | ORAL_TABLET | Freq: Four times a day (QID) | ORAL | 0 refills | Status: DC | PRN

## 2021-01-26 MED ORDER — IBUPROFEN 600 MG PO TABS
600.0000 mg | ORAL_TABLET | Freq: Once | ORAL | Status: AC
  Administered 2021-01-26: 600 mg via ORAL
  Filled 2021-01-26: qty 1

## 2021-01-26 NOTE — MAU Provider Note (Signed)
History     CSN: [id number]  Arrival date and time: 01/26/21 0140   Event Date/Time   First Provider Initiated Contact with Patient 01/26/21 0319      Chief Complaint  Patient presents with   Vaginal Bleeding   Abdominal Pain   HPI [name] is a 35 y.o. year old G1P0 female at [redacted]w[redacted]d weeks gestation who presents to MAU reporting vaginal bleeding since 01/24/21 with wiping. She came to MAU with tissue in her panties that has small amount of blood on it. She report lower abdominal cramping/pain that started 01/25/21 @ 1800. She was seen in MAU on 01/21/21 and 01/23/21; HCG were 84 & 92 respectively. She had a repeat HCG of 87 on 01/25/21. She was given miscarriage precautions. She reports VB that is more than she has had previously.  OB History     Gravida  1   Para      Term      Preterm      AB      Living         SAB      IAB      Ectopic      Multiple      Live Births              Past Medical History:  Diagnosis Date   Abscess    Asthma    Bronchitis     Past Surgical History:  Procedure Laterality Date   IRRIGATION AND DEBRIDEMENT ABSCESS Right 08/06/2018   Procedure: IRRIGATION AND DEBRIDEMENT AXILLARY ABSCESS;  Surgeon: Berna Bue, MD;  Location: [facility];  Service: General;  Laterality: Right;    Family History  Problem Relation Age of Onset   Hypertension Mother    Diabetes Maternal Uncle     Social History   Tobacco Use   Smoking status: Every Day   Smokeless tobacco: Never  Substance Use Topics   Alcohol use: Not Currently    Comment: occasional    Drug use: Not Currently    Allergies: No Known Allergies  Medications Prior to Admission  Medication Sig Dispense Refill Last Dose   acetaminophen (TYLENOL) 500 MG tablet You can take 1000 mg every 8 hours for the first 2 to 3 days to help with the pain.  As the pain improves you can go back to using it as needed.  Do not take more than 4000 mg of  Tylenol/acetaminophen per day it can harm your liver.  You can buy this over-the-counter at any drugstore. 30 tablet 0 Past Week   albuterol (VENTOLIN HFA) 108 (90 Base) MCG/ACT inhaler Inhale 1-2 puffs into the lungs every 6 (six) hours as needed for wheezing or shortness of breath. 6.7 g 1 More than a month   cyclobenzaprine (FLEXERIL) 10 MG tablet Take 0.5-1 tablets (5-10 mg total) by mouth 2 (two) times daily as needed for muscle spasms. 20 tablet 0 More than a month    Review of Systems  Constitutional: Negative.   HENT: Negative.    Eyes: Negative.   Respiratory: Negative.    Cardiovascular: Negative.   Gastrointestinal: Negative.   Endocrine: Negative.   Genitourinary:  Positive for pelvic pain (cramping) and vaginal bleeding (moderate bleeding).  Musculoskeletal: Negative.   Skin: Negative.   Allergic/Immunologic: Negative.   Neurological: Negative.   Hematological: Negative.   Psychiatric/Behavioral: Negative.    Physical Exam   Patient Vitals for the past 24 hrs:  BP Temp Temp  src Pulse Resp SpO2 Height Weight  01/26/21 0341 123/87 -- -- 94 -- -- -- --  01/26/21 0340 -- -- -- -- -- 100 % -- --  01/26/21 0338 117/78 -- -- 95 -- -- -- --  01/26/21 0337 116/71 -- -- 78 -- -- -- --  01/26/21 0336 112/66 -- -- 81 -- -- -- --  01/26/21 0221 108/79 98.4 F (36.9 C) Oral 89 20 -- 5\' 7"  (1.702 m) 94.6 kg    Physical Exam Vitals and nursing note reviewed. Exam conducted with a chaperone present.  Constitutional:      Appearance: Normal appearance. She is normal weight.  Cardiovascular:     Rate and Rhythm: Normal rate.  Pulmonary:     Effort: Pulmonary effort is normal.  Abdominal:     Palpations: Abdomen is soft.  Genitourinary:    General: Normal vulva.     Comments: Pelvic exam: External genitalia normal, SE: vaginal walls pink and well rugated, cervix is smooth, pink, no lesions, small amt of dark, red blood in vaginal vault, cervix closed/long/firm, Uterus is  non-tender, no CMT or friability, (+) LT adnexal tenderness.  Musculoskeletal:        General: Normal range of motion.  Skin:    General: Skin is warm and dry.  Neurological:     Mental Status: She is alert and oriented to person, place, and time.  Psychiatric:        Mood and Affect: Mood normal.        Behavior: Behavior normal.        Thought Content: Thought content normal.        Judgment: Judgment normal.    MAU Course  Procedures  MDM CBC Orthostatic VS -- WNL  *Consult with Dr. @ [phone] - notified of patient's complaints, assessments, lab & U/S results, tx plan d/c home with ibuprofen and tylenol #3, keep f/u appt with [facility] on 11/14, return to MAU for heavier bleeding and/or pain - ok to d/c home, agrees with plan   Results for orders placed or performed during the hospital encounter of 01/26/21 (from the past 24 hour(s))  CBC     Status: None   Collection Time: 01/26/21  3:41 AM  Result Value Ref Range   WBC 8.3 4.0 - 10.5 K/uL   RBC 4.14 3.87 - 5.11 MIL/uL   Hemoglobin 12.5 12.0 - 15.0 g/dL   HCT 13/01/22 79.8 - 92.1 %   MCV 88.9 80.0 - 100.0 fL   MCH 30.2 26.0 - 34.0 pg   MCHC 34.0 30.0 - 36.0 g/dL   RDW 19.4 17.4 - 08.1 %   Platelets 328 150 - 400 K/uL   nRBC 0.0 0.0 - 0.2 %    Assessment and Plan  Vaginal bleeding affecting early pregnancy - Plan: Discharge patient  - Return to MAU: If you have heavier bleeding that soaks through more that 2 pads per hour for an hour or more If you bleed so much that you feel like you might pass out or you do pass out If you have significant abdominal pain that is not improved with Tylenol 1000 mg every 8 hours as needed for pain If you develop a fever > 100.5  - Information provided on threatened miscarriage and VB in 1st trimester pregnancy  - Keep scheduled appt at [facility] on 02/08/2021 - Patient verbalized an understanding of the plan of care and agrees.     02/10/2021, CNM 01/26/2021, 3:19 AM

## 2021-01-26 NOTE — MAU Note (Signed)
PT SAYS VB STARTED Sunday NIGHT - WHEN SHE WIPED. IN TRIAGE - TISSUE IN PANTS- RED BLOOD - SMALL CRAMPING STARTED Monday 6PM WAS HERE ON 10-27 AND 29  FOR U/S AND LABS

## 2021-01-26 NOTE — Discharge Instructions (Signed)
Return to MAU: If you have heavier bleeding that soaks through more that 2 pads per hour for an hour or more If you bleed so much that you feel like you might pass out or you do pass out If you have significant abdominal pain that is not improved with Tylenol 1000 mg every 8 hours as needed for pain If you develop a fever > 100.5  

## 2021-02-01 ENCOUNTER — Other Ambulatory Visit: Payer: Self-pay | Admitting: *Deleted

## 2021-02-01 DIAGNOSIS — O209 Hemorrhage in early pregnancy, unspecified: Secondary | ICD-10-CM

## 2021-02-01 NOTE — Progress Notes (Signed)
Opened in error

## 2021-02-08 ENCOUNTER — Other Ambulatory Visit: Payer: Self-pay

## 2021-02-08 DIAGNOSIS — O039 Complete or unspecified spontaneous abortion without complication: Secondary | ICD-10-CM

## 2021-02-09 LAB — BETA HCG QUANT (REF LAB): hCG Quant: <1 m[IU]/mL

## 2021-04-13 ENCOUNTER — Other Ambulatory Visit: Payer: Self-pay

## 2021-04-13 ENCOUNTER — Encounter (HOSPITAL_COMMUNITY): Payer: Self-pay

## 2021-04-13 ENCOUNTER — Emergency Department (HOSPITAL_COMMUNITY): Payer: Medicaid Other

## 2021-04-13 ENCOUNTER — Emergency Department (HOSPITAL_COMMUNITY)
Admission: EM | Admit: 2021-04-13 | Discharge: 2021-04-14 | Disposition: A | Discharge status: Home / Self Care | Payer: Medicaid Other | Attending: Emergency Medicine | Admitting: Emergency Medicine

## 2021-04-13 DIAGNOSIS — M549 Dorsalgia, unspecified: Secondary | ICD-10-CM | POA: Insufficient documentation

## 2021-04-13 DIAGNOSIS — R0602 Shortness of breath: Secondary | ICD-10-CM | POA: Diagnosis not present

## 2021-04-13 DIAGNOSIS — R079 Chest pain, unspecified: Secondary | ICD-10-CM | POA: Diagnosis not present

## 2021-04-13 DIAGNOSIS — Z5321 Procedure and treatment not carried out due to patient leaving prior to being seen by health care provider: Secondary | ICD-10-CM | POA: Diagnosis not present

## 2021-04-13 LAB — CBC WITH DIFFERENTIAL/PLATELET
Abs Immature Granulocytes: 0.01 10*3/uL (ref 0.00–0.07)
Basophils Absolute: 0 10*3/uL (ref 0.0–0.1)
Basophils Relative: 0 %
Eosinophils Absolute: 0.1 10*3/uL (ref 0.0–0.5)
Eosinophils Relative: 1 %
HCT: 40.5 % (ref 36.0–46.0)
Hemoglobin: 13.8 g/dL (ref 12.0–15.0)
Immature Granulocytes: 0 %
Lymphocytes Relative: 43 %
Lymphs Abs: 2.6 10*3/uL (ref 0.7–4.0)
MCH: 30.3 pg (ref 26.0–34.0)
MCHC: 34.1 g/dL (ref 30.0–36.0)
MCV: 89 fL (ref 80.0–100.0)
Monocytes Absolute: 0.4 10*3/uL (ref 0.1–1.0)
Monocytes Relative: 6 %
Neutro Abs: 3 10*3/uL (ref 1.7–7.7)
Neutrophils Relative %: 50 %
Platelets: 279 10*3/uL (ref 150–400)
RBC: 4.55 MIL/uL (ref 3.87–5.11)
RDW: 13.1 % (ref 11.5–15.5)
WBC: 6 10*3/uL (ref 4.0–10.5)
nRBC: 0 % (ref 0.0–0.2)

## 2021-04-13 LAB — I-STAT BETA HCG BLOOD, ED (MC, WL, AP ONLY): I-stat hCG, quantitative: <5 m[IU]/mL (ref <5)

## 2021-04-13 LAB — BASIC METABOLIC PANEL
Anion gap: 6 (ref 5–15)
BUN: 9 mg/dL (ref 6–20)
CO2: 24 mmol/L (ref 22–32)
Calcium: 8.9 mg/dL (ref 8.9–10.3)
Chloride: 105 mmol/L (ref 98–111)
Creatinine, Ser: 0.71 mg/dL (ref 0.44–1.00)
GFR, Estimated: >60 mL/min (ref >60)
Glucose, Bld: 92 mg/dL (ref 70–99)
Potassium: 3.8 mmol/L (ref 3.5–5.1)
Sodium: 135 mmol/L (ref 135–145)

## 2021-04-13 LAB — TROPONIN I (HIGH SENSITIVITY): Troponin I (High Sensitivity): <2 ng/L (ref <18)

## 2021-04-13 MED ORDER — ALBUTEROL SULFATE HFA 108 (90 BASE) MCG/ACT IN AERS: 2.0000 | INHALATION_SPRAY | RESPIRATORY_TRACT | Status: DC | PRN

## 2021-04-13 NOTE — ED Triage Notes (Signed)
Patient reports that she began having mid back pain that radiates into her chest. Patient also c/o SOB. Patient denies a cough, fever.

## 2021-04-13 NOTE — ED Provider Triage Note (Signed)
Emergency Medicine Provider Triage Evaluation Note  Dana Morris , a 36 y.o. female  was evaluated in triage.  Pt complains of back pain that radiates around to her chest.  Pain started at 0 300 this morning and has been constant since then.  Patient endorses associated shortness of breath.  Patient denies any recent falls, traumatic injuries, or heavy lifting.  Pain is worse with movement and touch.  Patient denies any recent surgery, history of cancer, history of DVT/PE, hormone therapy use.  Review of Systems  Positive: Back pain, chest pain Negative: Nausea, vomiting, diaphoresis, hemoptysis, cough  Physical Exam  BP 131/81 (BP Location: Right Arm)    Pulse 84    Temp 98.2 F (36.8 C) (Oral)    Resp 16    Ht 5\' 7"  (1.702 m)    Wt 90.7 kg    LMP 03/27/2021 (Approximate)    SpO2 100%    BMI 31.32 kg/m  Gen:   Awake, no distress   Resp:  Normal effort, lungs clear to auscultation bilaterally MSK:   Moves extremities without difficulty; no midline tenderness deformity to cervical, thoracic, or lumbar spine.  Tenderness to bilateral thoracic back Other:    Medical Decision Making  Medically screening exam initiated at 1:50 PM.  Appropriate orders placed.  SHALAY HEIDEL was informed that the remainder of the evaluation will be completed by another provider, this initial triage assessment does not replace that evaluation, and the importance of remaining in the ED until their evaluation is complete.     Loni Beckwith, [place] 04/13/21 1352

## 2021-04-22 ENCOUNTER — Inpatient Hospital Stay (HOSPITAL_COMMUNITY)
Admission: AD | Admit: 2021-04-22 | Discharge: 2021-04-22 | Disposition: A | Discharge status: Home / Self Care | Payer: Medicaid Other | Attending: Obstetrics and Gynecology | Admitting: Obstetrics and Gynecology

## 2021-04-22 ENCOUNTER — Inpatient Hospital Stay (HOSPITAL_COMMUNITY): Payer: Medicaid Other

## 2021-04-22 ENCOUNTER — Encounter (HOSPITAL_COMMUNITY): Payer: Self-pay | Admitting: Obstetrics and Gynecology

## 2021-04-22 ENCOUNTER — Other Ambulatory Visit: Payer: Self-pay

## 2021-04-22 DIAGNOSIS — O26891 Other specified pregnancy related conditions, first trimester: Secondary | ICD-10-CM | POA: Insufficient documentation

## 2021-04-22 DIAGNOSIS — O26899 Other specified pregnancy related conditions, unspecified trimester: Secondary | ICD-10-CM

## 2021-04-22 DIAGNOSIS — Z3A01 Less than 8 weeks gestation of pregnancy: Secondary | ICD-10-CM | POA: Diagnosis not present

## 2021-04-22 DIAGNOSIS — R109 Unspecified abdominal pain: Secondary | ICD-10-CM | POA: Diagnosis not present

## 2021-04-22 DIAGNOSIS — O3680X0 Pregnancy with inconclusive fetal viability, not applicable or unspecified: Secondary | ICD-10-CM

## 2021-04-22 LAB — POCT PREGNANCY, URINE: Preg Test, Ur: POSITIVE — AB

## 2021-04-22 LAB — URINALYSIS, ROUTINE W REFLEX MICROSCOPIC
Bilirubin Urine: NEGATIVE
Glucose, UA: NEGATIVE mg/dL
Ketones, ur: NEGATIVE mg/dL
Leukocytes,Ua: NEGATIVE
Nitrite: NEGATIVE
Protein, ur: NEGATIVE mg/dL
Specific Gravity, Urine: 1.009 (ref 1.005–1.030)
pH: 5 (ref 5.0–8.0)

## 2021-04-22 LAB — CBC
HCT: 38.4 % (ref 36.0–46.0)
Hemoglobin: 12.9 g/dL (ref 12.0–15.0)
MCH: 29.7 pg (ref 26.0–34.0)
MCHC: 33.6 g/dL (ref 30.0–36.0)
MCV: 88.5 fL (ref 80.0–100.0)
Platelets: 300 10*3/uL (ref 150–400)
RBC: 4.34 MIL/uL (ref 3.87–5.11)
RDW: 12.9 % (ref 11.5–15.5)
WBC: 6.1 10*3/uL (ref 4.0–10.5)
nRBC: 0 % (ref 0.0–0.2)

## 2021-04-22 LAB — WET PREP, GENITAL
Sperm: NONE SEEN
Trich, Wet Prep: NONE SEEN
WBC, Wet Prep HPF POC: <10 (ref <10)
Yeast Wet Prep HPF POC: NONE SEEN

## 2021-04-22 LAB — HCG, QUANTITATIVE, PREGNANCY: hCG, Beta Chain, Quant, S: 262 m[IU]/mL — ABNORMAL HIGH (ref <5)

## 2021-04-22 NOTE — MAU Note (Signed)
Bottom of her stomach Is hurting, started last night.  Got worse today. No bleeding or d/c. Denies diarrhea or constipation. No urinary pain. +HPT 3 days ago.

## 2021-04-22 NOTE — MAU Provider Note (Signed)
History     CSN: [id number]  Arrival date and time: 04/22/21 1125   Event Date/Time   First Provider Initiated Contact with Patient 04/22/21 1215      Chief Complaint  Patient presents with   Abdominal Pain   Possible Pregnancy   HPI Dana Morris is a 36 y.o. G2P0010 at [redacted]w[redacted]d who presents with lower abdominal pain. She states the pain is a 3/10 and comes and goes. She denies any abnormal discharge or bleeding. She was seen at [facility] ED on 1/17 and had a negative HCG.  OB History     Gravida  2   Para      Term      Preterm      AB  1   Living         SAB  1   IAB      Ectopic      Multiple      Live Births              Past Medical History:  Diagnosis Date   Abscess    Asthma    Bronchitis     Past Surgical History:  Procedure Laterality Date   IRRIGATION AND DEBRIDEMENT ABSCESS Right 08/06/2018   Procedure: IRRIGATION AND DEBRIDEMENT AXILLARY ABSCESS;  Surgeon: Berna Bue, MD;  Location: [facility];  Service: General;  Laterality: Right;    Family History  Problem Relation Age of Onset   Hypertension Mother    Diabetes Maternal Uncle     Social History   Tobacco Use   Smoking status: Former    Types: Cigarettes   Smokeless tobacco: Never  Vaping Use   Vaping Use: Never used  Substance Use Topics   Alcohol use: Not Currently   Drug use: Not Currently    Allergies: No Known Allergies  Medications Prior to Admission  Medication Sig Dispense Refill Last Dose   Prenatal Vit-Fe Fumarate-FA (PRENATAL MULTIVITAMIN) TABS tablet Take 1 tablet by mouth daily at 12 noon.   04/21/2021   acetaminophen-codeine (TYLENOL #3) 300-30 MG tablet Take 1-2 tablets by mouth every 6 (six) hours as needed for moderate pain. 15 tablet 0    albuterol (VENTOLIN HFA) 108 (90 Base) MCG/ACT inhaler Inhale 1-2 puffs into the lungs every 6 (six) hours as needed for wheezing or shortness of breath. 6.7 g 1    ibuprofen (ADVIL) 600 MG tablet Take 1  tablet (600 mg total) by mouth every 6 (six) hours as needed. 60 tablet 0     Review of Systems  Constitutional: Negative.  Negative for fatigue and fever.  HENT: Negative.    Respiratory: Negative.  Negative for shortness of breath.   Cardiovascular: Negative.  Negative for chest pain.  Gastrointestinal:  Positive for abdominal pain. Negative for constipation, diarrhea, nausea and vomiting.  Genitourinary: Negative.  Negative for dysuria, vaginal bleeding and vaginal discharge.  Neurological: Negative.  Negative for dizziness and headaches.  Physical Exam   Blood pressure 122/77, pulse 81, temperature 98.5 F (36.9 C), temperature source Oral, resp. rate 16, height 5\' 7"  (1.702 m), weight 100.1 kg, last menstrual period 03/22/2021, SpO2 99 %.  Physical Exam Vitals and nursing note reviewed.  Constitutional:      General: She is not in acute distress.    Appearance: She is well-developed.  HENT:     Head: Normocephalic.  Eyes:     Pupils: Pupils are equal, round, and reactive to light.  Cardiovascular:  Rate and Rhythm: Normal rate and regular rhythm.     Heart sounds: Normal heart sounds.  Pulmonary:     Effort: Pulmonary effort is normal. No respiratory distress.     Breath sounds: Normal breath sounds.  Abdominal:     General: Bowel sounds are normal. There is no distension.     Palpations: Abdomen is soft.     Tenderness: There is no abdominal tenderness.  Skin:    General: Skin is warm and dry.  Neurological:     Mental Status: She is alert and oriented to person, place, and time.  Psychiatric:        Mood and Affect: Mood normal.        Behavior: Behavior normal.        Thought Content: Thought content normal.        Judgment: Judgment normal.    MAU Course  Procedures Results for orders placed or performed during the hospital encounter of 04/22/21 (from the past 24 hour(s))  Pregnancy, urine POC     Status: Abnormal   Collection Time: 04/22/21 11:40 AM   Result Value Ref Range   Preg Test, Ur POSITIVE (A) NEGATIVE  Urinalysis, Routine w reflex microscopic Urine, Clean Catch     Status: Abnormal   Collection Time: 04/22/21 11:45 AM  Result Value Ref Range   Color, Urine YELLOW YELLOW   APPearance CLEAR CLEAR   Specific Gravity, Urine 1.009 1.005 - 1.030   pH 5.0 5.0 - 8.0   Glucose, UA NEGATIVE NEGATIVE mg/dL   Hgb urine dipstick SMALL (A) NEGATIVE   Bilirubin Urine NEGATIVE NEGATIVE   Ketones, ur NEGATIVE NEGATIVE mg/dL   Protein, ur NEGATIVE NEGATIVE mg/dL   Nitrite NEGATIVE NEGATIVE   Leukocytes,Ua NEGATIVE NEGATIVE   RBC / HPF 0-5 0 - 5 RBC/hpf   WBC, UA 0-5 0 - 5 WBC/hpf   Bacteria, UA RARE (A) NONE SEEN   Squamous Epithelial / LPF 0-5 0 - 5  CBC     Status: None   Collection Time: 04/22/21 11:59 AM  Result Value Ref Range   WBC 6.1 4.0 - 10.5 K/uL   RBC 4.34 3.87 - 5.11 MIL/uL   Hemoglobin 12.9 12.0 - 15.0 g/dL   HCT 16.138.4 09.636.0 - 04.546.0 %   MCV 88.5 80.0 - 100.0 fL   MCH 29.7 26.0 - 34.0 pg   MCHC 33.6 30.0 - 36.0 g/dL   RDW 40.912.9 81.111.5 - 91.415.5 %   Platelets 300 150 - 400 K/uL   nRBC 0.0 0.0 - 0.2 %  hCG, quantitative, pregnancy     Status: Abnormal   Collection Time: 04/22/21 11:59 AM  Result Value Ref Range   hCG, Beta Chain, Quant, S 262 (H) <5 mIU/mL  Wet prep, genital     Status: Abnormal   Collection Time: 04/22/21 12:12 PM   Specimen: Vaginal  Result Value Ref Range   Yeast Wet Prep HPF POC NONE SEEN NONE SEEN   Trich, Wet Prep NONE SEEN NONE SEEN   Clue Cells Wet Prep HPF POC PRESENT (A) NONE SEEN   WBC, Wet Prep HPF POC <10 <10   Sperm NONE SEEN     US OB LESS THAN 14 WEEKS WITH OB TRANSVAGINAL  Result Date: 04/22/2021 CLINICAL DATA:  Low pelvic pain x1 day worse today. Quantitative beta hCG of 262. EXAM: OBSTETRIC <14 WK ULTRASOUND TECHNIQUE: Transabdominal ultrasound was performed for evaluation of the gestation as well as the maternal uterus and adnexal regions.  COMPARISON:  None. FINDINGS: Intrauterine  gestational sac: None Yolk sac:  Not Visualized. Embryo:  Not Visualized. Cardiac Activity: Not Visualized. Subchorionic hemorrhage:  None visualized. Maternal uterus/adnexae: Within the left adnexa there is a thick walled vascular lesion measuring 1.5 x 1.3 x 1.1 cm, which can not be sonographically displaced from the left ovary and is most likely located within the ovary itself, indicating it is most likely a corpus luteum. There is endometrial thickening. Right ovary is unremarkable. IMPRESSION: Findings most consistent with pregnancy of unknown location. No intrauterine gestational sac. While there is a thick walled heterogeneous vascular structure in the left adnexa this appears to be located within the left ovary and is most consistent with a corpus luteum, although an ovarian ectopic pregnancy can not be completely excluded on this study it is felt significantly less likely. Sonographic differential diagnosis includes intrauterine gestation too early to visualize, spontaneous abortion or ectopic gestation. Recommend close clinical follow-up and serial serum beta HCG monitoring, with repeat obstetric scan as warranted by beta HCG levels and clinical assessment. Electronically Signed   By: Maudry Mayhew M.D.   On: 04/22/2021 13:28     MDM UA, UPT CBC, HCG ABO/Rh-  Wet prep and gc/chlamydia US OB Comp Less 14 weeks with Transvaginal  Discussed with client the diagnosis of pregnancy of unknown anatomic location.  Three possibilities of outcome are: a healthy pregnancy that is too early to see a yolk sac to confirm the pregnancy is in the uterus, a pregnancy that is not healthy and has not developed and will not develop, and an ectopic pregnancy that is in the abdomen that cannot be identified at this time.  And ectopic pregnancy can be a life threatening situation as a pregnancy needs to be in the uterus which is a muscle and can stretch to accommodate the growth of a pregnancy.  Other structures in the  pelvis and abdomen as not muscular and do not stretch with the growth of a pregnancy.  Worst case scenario is that a structure ruptures with a growing pregnancy not in the uterus and and internal hemorrhage can be a life threatening situation.  We need to follow the progression of this pregnancy carefully.  We need to check another serum pregnancy hormone level to determine if the levels are rising appropriately  and to determine the next steps that are needed for you. Patient's questions were answered.   Assessment and Plan   1. Pregnancy of unknown anatomic location   2. Abdominal pain affecting pregnancy   3. [redacted] weeks gestation of pregnancy    -Discharge home in stable condition -Strict ectopic precautions discussed -Patient advised to follow-up with [facility] on Saturday for repeat labs -Patient may return to MAU as needed or if her condition were to change or worsen    Rolm Bookbinder CNM 04/22/2021, 12:15 PM

## 2021-04-22 NOTE — Discharge Instructions (Signed)

## 2021-04-23 LAB — GC/CHLAMYDIA PROBE AMP (~~LOC~~) NOT AT ARMC
Chlamydia: NEGATIVE
Comment: NEGATIVE
Comment: NORMAL
Neisseria Gonorrhea: NEGATIVE

## 2021-04-24 ENCOUNTER — Inpatient Hospital Stay (HOSPITAL_COMMUNITY)
Admission: AD | Admit: 2021-04-24 | Discharge: 2021-04-24 | Disposition: A | Discharge status: Home / Self Care | Payer: Medicaid Other | Attending: Obstetrics & Gynecology | Admitting: Obstetrics & Gynecology

## 2021-04-24 ENCOUNTER — Other Ambulatory Visit: Payer: Self-pay

## 2021-04-24 ENCOUNTER — Inpatient Hospital Stay (HOSPITAL_COMMUNITY)
Admit: 2021-04-24 | Discharge: 2021-04-24 | Disposition: A | Discharge status: Home / Self Care | Payer: Medicaid Other | Attending: Obstetrics and Gynecology | Admitting: Obstetrics and Gynecology

## 2021-04-24 DIAGNOSIS — E349 Endocrine disorder, unspecified: Secondary | ICD-10-CM | POA: Diagnosis not present

## 2021-04-24 DIAGNOSIS — Z3490 Encounter for supervision of normal pregnancy, unspecified, unspecified trimester: Secondary | ICD-10-CM

## 2021-04-24 DIAGNOSIS — Z09 Encounter for follow-up examination after completed treatment for conditions other than malignant neoplasm: Secondary | ICD-10-CM | POA: Diagnosis not present

## 2021-04-24 DIAGNOSIS — Z3A01 Less than 8 weeks gestation of pregnancy: Secondary | ICD-10-CM | POA: Diagnosis not present

## 2021-04-24 DIAGNOSIS — O99281 Endocrine, nutritional and metabolic diseases complicating pregnancy, first trimester: Secondary | ICD-10-CM | POA: Insufficient documentation

## 2021-04-24 LAB — HCG, QUANTITATIVE, PREGNANCY: hCG, Beta Chain, Quant, S: 732 m[IU]/mL — ABNORMAL HIGH (ref <5)

## 2021-04-24 NOTE — Discharge Instructions (Signed)

## 2021-04-24 NOTE — MAU Note (Signed)
Pt reports to mau for followup blood work. Denies pain or vag bleeding

## 2021-04-24 NOTE — MAU Provider Note (Signed)
History   Chief Complaint:  Follow-up   Dana Morris is  36 y.o. G2P0010 Patient's last menstrual period was 03/22/2021 (approximate).. Patient is here for follow up of quantitative HCG and ongoing surveillance of pregnancy status. She is [redacted]w[redacted]d weeks gestation  by LMP.    Since her last visit, the patient is without new complaint. The patient reports bleeding as  none now.  She denies any pain.  General ROS:  negative  Her previous Quantitative HCG values are:  1/26: 262   Physical Exam   Blood pressure 121/78, pulse 80, temperature 98.5 F (36.9 C), temperature source Oral, resp. rate 15, last menstrual period 03/22/2021, SpO2 99 %.  Physical Exam Vitals and nursing note reviewed.  Constitutional:      General: She is not in acute distress.    Appearance: She is well-developed.  HENT:     Head: Normocephalic.  Eyes:     Pupils: Pupils are equal, round, and reactive to light.  Cardiovascular:     Rate and Rhythm: Normal rate and regular rhythm.  Pulmonary:     Effort: Pulmonary effort is normal. No respiratory distress.     Breath sounds: Normal breath sounds.  Abdominal:     Palpations: Abdomen is soft.     Tenderness: There is no abdominal tenderness.  Musculoskeletal:        General: Normal range of motion.     Cervical back: Normal range of motion.  Skin:    General: Skin is warm and dry.  Neurological:     Mental Status: She is alert and oriented to person, place, and time.  Psychiatric:        Behavior: Behavior normal.        Thought Content: Thought content normal.        Judgment: Judgment normal.     Labs: Results for orders placed or performed during the hospital encounter of 04/24/21 (from the past 24 hour(s))  hCG, quantitative, pregnancy   Collection Time: 04/24/21 12:05 PM  Result Value Ref Range   hCG, Beta Chain, Quant, S 732 (H) <5 mIU/mL    Assessment:   1. Elevated serum hCG   2. Follow-up exam   3. Early stage of pregnancy      Patient left before receiving results. Attempted to call with no answer. MyChart message sent.   Plan: -Discharge home in stable condition -Patient advised to follow-up with [facility] in 2 weeks for repeat ultrasound. -Patient may return to MAU as needed or if her condition were to change or worsen  Wende Mott, CNM 04/24/2021, 1:19 PM

## 2021-04-27 ENCOUNTER — Other Ambulatory Visit: Payer: Self-pay

## 2021-04-27 ENCOUNTER — Inpatient Hospital Stay (HOSPITAL_COMMUNITY)
Admission: AD | Admit: 2021-04-27 | Discharge: 2021-04-27 | Disposition: A | Discharge status: Home / Self Care | Payer: Medicaid Other | Attending: Obstetrics and Gynecology | Admitting: Obstetrics and Gynecology

## 2021-04-27 DIAGNOSIS — O3680X0 Pregnancy with inconclusive fetal viability, not applicable or unspecified: Secondary | ICD-10-CM

## 2021-04-27 DIAGNOSIS — Z87891 Personal history of nicotine dependence: Secondary | ICD-10-CM | POA: Diagnosis not present

## 2021-04-27 DIAGNOSIS — O209 Hemorrhage in early pregnancy, unspecified: Secondary | ICD-10-CM | POA: Insufficient documentation

## 2021-04-27 DIAGNOSIS — Z3A01 Less than 8 weeks gestation of pregnancy: Secondary | ICD-10-CM | POA: Diagnosis not present

## 2021-04-27 LAB — CBC
HCT: 37.5 % (ref 36.0–46.0)
Hemoglobin: 12.6 g/dL (ref 12.0–15.0)
MCH: 30 pg (ref 26.0–34.0)
MCHC: 33.6 g/dL (ref 30.0–36.0)
MCV: 89.3 fL (ref 80.0–100.0)
Platelets: 316 10*3/uL (ref 150–400)
RBC: 4.2 MIL/uL (ref 3.87–5.11)
RDW: 13.2 % (ref 11.5–15.5)
WBC: 6.5 10*3/uL (ref 4.0–10.5)
nRBC: 0 % (ref 0.0–0.2)

## 2021-04-27 LAB — HCG, QUANTITATIVE, PREGNANCY: hCG, Beta Chain, Quant, S: 1177 m[IU]/mL — ABNORMAL HIGH (ref <5)

## 2021-04-27 NOTE — MAU Note (Signed)
Started bleeding/spotting on Sunday. Has continued to spot since then, pinkish. No pain.

## 2021-04-27 NOTE — MAU Provider Note (Addendum)
History     CSN: [id number]  Arrival date and time: 04/27/21 1803   Event Date/Time   First Provider Initiated Contact with Patient 04/27/21 1826      Chief Complaint  Patient presents with   Vaginal Bleeding   HPI Dana Morris is a 36 y.o. G2P0010 at [redacted]w[redacted]d with pregnancy of unknown location who presents to MAU with chief complaint of vaginal spotting. This is a new problem, onset Sunday 04/25/2021. Patient endorses seeing smears of pink-tinged discharge when she wipes after voiding. She denies pain. She denies heavy vaginal bleeding. She is not wearing a pad and has not visualized spotting on her underwear. She denies weakness, dizziness, syncope.  Patient is s/p evaluation for pregnancy of unknown location with appropriate rise in quant hCG. She is scheduled for a repeat ultrasound on 05/06/2021  OB History     Gravida  2   Para      Term      Preterm      AB  1   Living         SAB  1   IAB      Ectopic      Multiple      Live Births              Past Medical History:  Diagnosis Date   Abscess    Asthma    Bronchitis     Past Surgical History:  Procedure Laterality Date   IRRIGATION AND DEBRIDEMENT ABSCESS Right 08/06/2018   Procedure: IRRIGATION AND DEBRIDEMENT AXILLARY ABSCESS;  Surgeon: Clovis Riley, MD;  Location: [facility];  Service: General;  Laterality: Right;    Family History  Problem Relation Age of Onset   Hypertension Mother    Diabetes Maternal Uncle     Social History   Tobacco Use   Smoking status: Former    Types: Cigarettes   Smokeless tobacco: Never  Vaping Use   Vaping Use: Never used  Substance Use Topics   Alcohol use: Not Currently   Drug use: Not Currently    Allergies: No Known Allergies  Medications Prior to Admission  Medication Sig Dispense Refill Last Dose   albuterol (VENTOLIN HFA) 108 (90 Base) MCG/ACT inhaler Inhale 1-2 puffs into the lungs every 6 (six) hours as needed for wheezing or  shortness of breath. 6.7 g 1    Prenatal Vit-Fe Fumarate-FA (PRENATAL MULTIVITAMIN) TABS tablet Take 1 tablet by mouth daily at 12 noon.       Review of Systems  Genitourinary:  Positive for vaginal bleeding.  All other systems reviewed and are negative. Physical Exam   Blood pressure 118/77, pulse 89, temperature 98.3 F (36.8 C), temperature source Oral, resp. rate 16, height 5\' 7"  (1.702 m), weight 100.6 kg, last menstrual period 03/22/2021, SpO2 100 %.  Physical Exam Vitals and nursing note reviewed. Exam conducted with a chaperone present.  Constitutional:      Appearance: Normal appearance.  Cardiovascular:     Rate and Rhythm: Normal rate.     Pulses: Normal pulses.  Pulmonary:     Effort: Pulmonary effort is normal.  Abdominal:     General: Abdomen is flat.  Skin:    Capillary Refill: Capillary refill takes less than 2 seconds.  Neurological:     Mental Status: She is alert and oriented to person, place, and time.  Psychiatric:        Mood and Affect: Mood normal.  Behavior: Behavior normal.        Thought Content: Thought content normal.        Judgment: Judgment normal.    MAU Course/MDM  Procedures  Orders Placed This Encounter  Procedures   CBC   hCG, quantitative, pregnancy   Patient Vitals for the past 24 hrs:  BP Temp Temp src Pulse Resp SpO2 Height Weight  04/27/21 1823 118/77 98.3 F (36.8 C) Oral 89 16 100 % 5\' 7"  (1.702 m) 100.6 kg   Report given to D. Moshe Cipro, CNM who assumes care of patient at this time.  Mallie Snooks, MSA, MSN, CNM Certified Nurse Midwife, [facility] for [employer], [place] 04/27/21 7:45 PM  HCG rise from 730 too 1100. D/w Dr. Harolyn Rutherford, patient to return in 22 hours for repeat bHCG. [facility] lab appointments overbooked, so patient will return to MAU for repeat bHCG. Keep ultrasound as scheduled on 05/06/21.   Assessment and Plan  Pregnancy of unknown location  - Discharge  home in stable condition - Strict return precautions reviewed. Return to MAU sooner or as needed for worsening symptoms - Return to MAU on 04/29/21 for repeat [place], CNM 04/27/21 8:21 PM

## 2021-04-27 NOTE — MAU Note (Signed)
Pt was given discharge instructions by RN and pt verbalized understanding and signed discharge paperwork.

## 2021-04-29 ENCOUNTER — Inpatient Hospital Stay (HOSPITAL_COMMUNITY): Payer: Medicaid Other

## 2021-04-29 ENCOUNTER — Inpatient Hospital Stay (HOSPITAL_COMMUNITY)
Admission: AD | Admit: 2021-04-29 | Discharge: 2021-04-29 | Disposition: A | Discharge status: Home / Self Care | Payer: Medicaid Other | Attending: Obstetrics and Gynecology | Admitting: Obstetrics and Gynecology

## 2021-04-29 ENCOUNTER — Other Ambulatory Visit: Payer: Self-pay

## 2021-04-29 DIAGNOSIS — O26851 Spotting complicating pregnancy, first trimester: Secondary | ICD-10-CM | POA: Diagnosis present

## 2021-04-29 DIAGNOSIS — O3680X0 Pregnancy with inconclusive fetal viability, not applicable or unspecified: Secondary | ICD-10-CM | POA: Diagnosis not present

## 2021-04-29 DIAGNOSIS — Z3A01 Less than 8 weeks gestation of pregnancy: Secondary | ICD-10-CM | POA: Diagnosis not present

## 2021-04-29 DIAGNOSIS — N939 Abnormal uterine and vaginal bleeding, unspecified: Secondary | ICD-10-CM | POA: Diagnosis not present

## 2021-04-29 LAB — HCG, QUANTITATIVE, PREGNANCY: hCG, Beta Chain, Quant, S: 1205 m[IU]/mL — ABNORMAL HIGH (ref <5)

## 2021-04-29 NOTE — MAU Note (Signed)
Presents for HCG level.  Denies pain, endorses spotting.

## 2021-04-29 NOTE — MAU Provider Note (Signed)
History     CSN: [id number]  Arrival date and time: 04/29/21 1659   None     Chief Complaint  Patient presents with   HCG Level   HPI  Ms.Dana Morris is a 36 y.o. female G2P0010 @ [redacted]w[redacted]d here in MAU with complaints of vaginal bleeding, she is here for a f/u Hcg level. Her quants have been followed closely 2/2 to pregnancy of unknown location. She has no pain. The bleeding is light. The bleeding does not soak a pad.The bleeding is dar red. She has a history of SAB.   1/26 Hcg: 262 1/28 Hcg: 732 1/31 Hcg: 1177 2/2  Hcg: 1205  OB History     Gravida  2   Para      Term      Preterm      AB  1   Living         SAB  1   IAB      Ectopic      Multiple      Live Births              Past Medical History:  Diagnosis Date   Abscess    Asthma    Bronchitis     Past Surgical History:  Procedure Laterality Date   IRRIGATION AND DEBRIDEMENT ABSCESS Right 08/06/2018   Procedure: IRRIGATION AND DEBRIDEMENT AXILLARY ABSCESS;  Surgeon: Berna Bue, MD;  Location: [facility];  Service: General;  Laterality: Right;    Family History  Problem Relation Age of Onset   Hypertension Mother    Diabetes Maternal Uncle     Social History   Tobacco Use   Smoking status: Former    Types: Cigarettes   Smokeless tobacco: Never  Vaping Use   Vaping Use: Never used  Substance Use Topics   Alcohol use: Not Currently   Drug use: Not Currently    Allergies: No Known Allergies  Medications Prior to Admission  Medication Sig Dispense Refill Last Dose   albuterol (VENTOLIN HFA) 108 (90 Base) MCG/ACT inhaler Inhale 1-2 puffs into the lungs every 6 (six) hours as needed for wheezing or shortness of breath. 6.7 g 1    Prenatal Vit-Fe Fumarate-FA (PRENATAL MULTIVITAMIN) TABS tablet Take 1 tablet by mouth daily at 12 noon.      Results for orders placed or performed during the hospital encounter of 04/29/21 (from the past 48 hour(s))  hCG, quantitative, pregnancy      Status: Abnormal   Collection Time: 04/29/21  5:19 PM  Result Value Ref Range   hCG, Beta Chain, Quant, S 1,205 (H) <5 mIU/mL    Comment:          GEST. AGE      CONC.  (mIU/mL)   <=1 WEEK        5 - 50     2 WEEKS       50 - 500     3 WEEKS       100 - 10,000     4 WEEKS     1,000 - 30,000     5 WEEKS     3,500 - 115,000   6-8 WEEKS     12,000 - 270,000    12 WEEKS     15,000 - 220,000        FEMALE AND NON-PREGNANT FEMALE:     LESS THAN 5 mIU/mL Performed at [facility], [address], [place], [place]  [address]     Review of Systems  Constitutional:  Negative for fever.  Gastrointestinal:  Negative for abdominal pain.  Genitourinary:  Positive for vaginal bleeding.  Physical Exam   Blood pressure 115/71, pulse 83, temperature 99 F (37.2 C), temperature source Oral, resp. rate 18, height 5\' 7"  (1.702 m), weight 101.5 kg, last menstrual period 03/22/2021, SpO2 100 %.  Physical Exam Vitals and nursing note reviewed.  Constitutional:      General: She is not in acute distress.    Appearance: Normal appearance. She is not ill-appearing, toxic-appearing or diaphoretic.  Pulmonary:     Effort: Pulmonary effort is normal.  Skin:    General: Skin is warm.  Neurological:     Mental Status: She is alert and oriented to person, place, and time.  Psychiatric:        Behavior: Behavior normal.   MAU Course  Procedures None  MDM  O positive blood type  Hcg levels reviewed with Dr. 03/24/2021, patient had some normally rising quants initially. Also her bleeding has increased which could mean SAB. This is a desired pregnancy. She is coming back to MAU on Saturday for an Hcg level. She is aware that if Hcg level remains the same treatment will be offered.   Assessment and Plan   A:  1. Pregnancy of unknown anatomic location   2. Vaginal bleeding   3. [redacted] weeks gestation of pregnancy      P;  Discharge home Return to MAU on Saturday for quant and possibly MTX  depending on hcg level Strict return precautions Pelvic rest Support given  Saturday, NP 04/29/2021 6:57 PM

## 2021-05-01 ENCOUNTER — Other Ambulatory Visit: Payer: Self-pay

## 2021-05-01 ENCOUNTER — Encounter (HOSPITAL_COMMUNITY): Payer: Self-pay | Admitting: Obstetrics and Gynecology

## 2021-05-01 ENCOUNTER — Inpatient Hospital Stay (HOSPITAL_COMMUNITY): Payer: Medicaid Other

## 2021-05-01 ENCOUNTER — Inpatient Hospital Stay (HOSPITAL_COMMUNITY): Payer: Medicaid Other | Admitting: Registered Nurse

## 2021-05-01 ENCOUNTER — Inpatient Hospital Stay (HOSPITAL_COMMUNITY)
Admission: AD | Admit: 2021-05-01 | Discharge: 2021-05-01 | Disposition: A | Discharge status: Home / Self Care | Payer: Medicaid Other | Attending: Obstetrics and Gynecology | Admitting: Obstetrics and Gynecology

## 2021-05-01 ENCOUNTER — Inpatient Hospital Stay (HOSPITAL_COMMUNITY): Payer: Medicaid Other | Attending: Obstetrics and Gynecology

## 2021-05-01 ENCOUNTER — Encounter (HOSPITAL_COMMUNITY)
Admission: AD | Disposition: A | Discharge status: Home / Self Care | Payer: Self-pay | Source: Home / Self Care | Attending: Obstetrics and Gynecology

## 2021-05-01 DIAGNOSIS — O009 Unspecified ectopic pregnancy without intrauterine pregnancy: Secondary | ICD-10-CM

## 2021-05-01 DIAGNOSIS — O00101 Right tubal pregnancy without intrauterine pregnancy: Secondary | ICD-10-CM | POA: Diagnosis not present

## 2021-05-01 DIAGNOSIS — O99511 Diseases of the respiratory system complicating pregnancy, first trimester: Secondary | ICD-10-CM | POA: Diagnosis not present

## 2021-05-01 DIAGNOSIS — Z87891 Personal history of nicotine dependence: Secondary | ICD-10-CM | POA: Diagnosis not present

## 2021-05-01 DIAGNOSIS — J45909 Unspecified asthma, uncomplicated: Secondary | ICD-10-CM | POA: Insufficient documentation

## 2021-05-01 DIAGNOSIS — Z3A01 Less than 8 weeks gestation of pregnancy: Secondary | ICD-10-CM | POA: Insufficient documentation

## 2021-05-01 HISTORY — PX: LAPAROSCOPIC UNILATERAL SALPINGECTOMY: SHX5934

## 2021-05-01 LAB — COMPREHENSIVE METABOLIC PANEL
ALT: 30 U/L (ref 0–44)
AST: 25 U/L (ref 15–41)
Albumin: 3.8 g/dL (ref 3.5–5.0)
Alkaline Phosphatase: 42 U/L (ref 38–126)
Anion gap: 5 (ref 5–15)
BUN: 6 mg/dL (ref 6–20)
CO2: 23 mmol/L (ref 22–32)
Calcium: 8.8 mg/dL — ABNORMAL LOW (ref 8.9–10.3)
Chloride: 106 mmol/L (ref 98–111)
Creatinine, Ser: 0.83 mg/dL (ref 0.44–1.00)
GFR, Estimated: >60 mL/min (ref >60)
Glucose, Bld: 110 mg/dL — ABNORMAL HIGH (ref 70–99)
Potassium: 3.9 mmol/L (ref 3.5–5.1)
Sodium: 134 mmol/L — ABNORMAL LOW (ref 135–145)
Total Bilirubin: 0.9 mg/dL (ref 0.3–1.2)
Total Protein: 6.5 g/dL (ref 6.5–8.1)

## 2021-05-01 LAB — TYPE AND SCREEN
ABO/RH(D): O POS
Antibody Screen: NEGATIVE

## 2021-05-01 LAB — HCG, QUANTITATIVE, PREGNANCY: hCG, Beta Chain, Quant, S: 819 m[IU]/mL — ABNORMAL HIGH (ref <5)

## 2021-05-01 LAB — CBC
HCT: 36.9 % (ref 36.0–46.0)
Hemoglobin: 12.4 g/dL (ref 12.0–15.0)
MCH: 30.2 pg (ref 26.0–34.0)
MCHC: 33.6 g/dL (ref 30.0–36.0)
MCV: 89.8 fL (ref 80.0–100.0)
Platelets: 288 10*3/uL (ref 150–400)
RBC: 4.11 MIL/uL (ref 3.87–5.11)
RDW: 13.2 % (ref 11.5–15.5)
WBC: 7 10*3/uL (ref 4.0–10.5)
nRBC: 0 % (ref 0.0–0.2)

## 2021-05-01 SURGERY — SALPINGECTOMY, UNILATERAL, LAPAROSCOPIC
Anesthesia: General | Laterality: Right

## 2021-05-01 MED ORDER — BUPIVACAINE HCL (PF) 0.25 % IJ SOLN
INTRAMUSCULAR | Status: AC
  Filled 2021-05-01: qty 30

## 2021-05-01 MED ORDER — OXYCODONE HCL 5 MG PO TABS
5.0000 mg | ORAL_TABLET | Freq: Once | ORAL | Status: AC | PRN
  Administered 2021-05-01: 5 mg via ORAL

## 2021-05-01 MED ORDER — SODIUM CHLORIDE (PF) 0.9 % IJ SOLN
INTRAMUSCULAR | Status: DC | PRN
Start: 2021-05-01 — End: 2021-05-01
  Administered 2021-05-01: 10 mL

## 2021-05-01 MED ORDER — FENTANYL CITRATE (PF) 100 MCG/2ML IJ SOLN
25.0000 ug | INTRAMUSCULAR | Status: DC | PRN
  Administered 2021-05-01 (×2): 50 ug via INTRAVENOUS

## 2021-05-01 MED ORDER — LACTATED RINGERS IV SOLN: INTRAVENOUS | Status: DC

## 2021-05-01 MED ORDER — SCOPOLAMINE 1 MG/3DAYS TD PT72
1.0000 | MEDICATED_PATCH | TRANSDERMAL | Status: DC
  Administered 2021-05-01: 1.5 mg via TRANSDERMAL

## 2021-05-01 MED ORDER — MIDAZOLAM HCL 2 MG/2ML IJ SOLN
INTRAMUSCULAR | Status: DC | PRN
  Administered 2021-05-01: 2 mg via INTRAVENOUS

## 2021-05-01 MED ORDER — FENTANYL CITRATE (PF) 100 MCG/2ML IJ SOLN
INTRAMUSCULAR | Status: AC
  Filled 2021-05-01: qty 2

## 2021-05-01 MED ORDER — CHLORHEXIDINE GLUCONATE 0.12 % MT SOLN
OROMUCOSAL | Status: AC
  Administered 2021-05-01: 15 mL via OROMUCOSAL
  Filled 2021-05-01: qty 15

## 2021-05-01 MED ORDER — CHLORHEXIDINE GLUCONATE 0.12 % MT SOLN: 15.0000 mL | Freq: Once | OROMUCOSAL | Status: AC

## 2021-05-01 MED ORDER — MIDAZOLAM HCL 2 MG/2ML IJ SOLN
INTRAMUSCULAR | Status: AC
  Filled 2021-05-01: qty 2

## 2021-05-01 MED ORDER — POVIDONE-IODINE 10 % EX SWAB
2.0000 "application " | Freq: Once | CUTANEOUS | Status: AC
  Administered 2021-05-01: 2 via TOPICAL

## 2021-05-01 MED ORDER — SUGAMMADEX SODIUM 200 MG/2ML IV SOLN
INTRAVENOUS | Status: DC | PRN
Start: 2021-05-01 — End: 2021-05-01
  Administered 2021-05-01: 200 mg via INTRAVENOUS

## 2021-05-01 MED ORDER — OXYCODONE HCL 5 MG PO TABS: 5.0000 mg | ORAL_TABLET | ORAL | 0 refills | Status: DC | PRN

## 2021-05-01 MED ORDER — FENTANYL CITRATE (PF) 250 MCG/5ML IJ SOLN
INTRAMUSCULAR | Status: AC
  Filled 2021-05-01: qty 5

## 2021-05-01 MED ORDER — PROPOFOL 10 MG/ML IV BOLUS
INTRAVENOUS | Status: DC | PRN
  Administered 2021-05-01: 160 mg via INTRAVENOUS

## 2021-05-01 MED ORDER — OXYCODONE HCL 5 MG/5ML PO SOLN: 5.0000 mg | Freq: Once | ORAL | Status: AC | PRN

## 2021-05-01 MED ORDER — KETOROLAC TROMETHAMINE 15 MG/ML IJ SOLN: 15.0000 mg | INTRAMUSCULAR | Status: AC

## 2021-05-01 MED ORDER — DEXAMETHASONE SODIUM PHOSPHATE 10 MG/ML IJ SOLN
INTRAMUSCULAR | Status: DC | PRN
  Administered 2021-05-01: 10 mg via INTRAVENOUS

## 2021-05-01 MED ORDER — PROMETHAZINE HCL 25 MG/ML IJ SOLN: 6.2500 mg | INTRAMUSCULAR | Status: DC | PRN

## 2021-05-01 MED ORDER — PROPOFOL 10 MG/ML IV BOLUS
INTRAVENOUS | Status: AC
  Filled 2021-05-01: qty 20

## 2021-05-01 MED ORDER — ONDANSETRON HCL 4 MG/2ML IJ SOLN
INTRAMUSCULAR | Status: DC | PRN
  Administered 2021-05-01: 4 mg via INTRAVENOUS

## 2021-05-01 MED ORDER — ACETAMINOPHEN 10 MG/ML IV SOLN
1000.0000 mg | Freq: Once | INTRAVENOUS | Status: DC | PRN
  Administered 2021-05-01: 1000 mg via INTRAVENOUS

## 2021-05-01 MED ORDER — LACTATED RINGERS IV BOLUS: 1000.0000 mL | Freq: Once | INTRAVENOUS | Status: DC

## 2021-05-01 MED ORDER — IBUPROFEN 600 MG PO TABS: 600.0000 mg | ORAL_TABLET | Freq: Four times a day (QID) | ORAL | 1 refills | Status: DC | PRN

## 2021-05-01 MED ORDER — SCOPOLAMINE 1 MG/3DAYS TD PT72
MEDICATED_PATCH | TRANSDERMAL | Status: AC
  Filled 2021-05-01: qty 1

## 2021-05-01 MED ORDER — ACETAMINOPHEN 10 MG/ML IV SOLN
INTRAVENOUS | Status: AC
  Filled 2021-05-01: qty 100

## 2021-05-01 MED ORDER — ACETAMINOPHEN 500 MG PO TABS: 1000.0000 mg | ORAL_TABLET | ORAL | Status: DC

## 2021-05-01 MED ORDER — ACETAMINOPHEN 500 MG PO TABS
1000.0000 mg | ORAL_TABLET | Freq: Once | ORAL | Status: AC
  Administered 2021-05-01: 1000 mg via ORAL
  Filled 2021-05-01: qty 2

## 2021-05-01 MED ORDER — KETOROLAC TROMETHAMINE 15 MG/ML IJ SOLN
INTRAMUSCULAR | Status: AC
  Administered 2021-05-01: 15 mg via INTRAVENOUS
  Filled 2021-05-01: qty 1

## 2021-05-01 MED ORDER — SUCCINYLCHOLINE CHLORIDE 200 MG/10ML IV SOSY
PREFILLED_SYRINGE | INTRAVENOUS | Status: DC | PRN
Start: 2021-05-01 — End: 2021-05-01
  Administered 2021-05-01: 120 mg via INTRAVENOUS

## 2021-05-01 MED ORDER — BUPIVACAINE HCL (PF) 0.25 % IJ SOLN
INTRAMUSCULAR | Status: DC | PRN
  Administered 2021-05-01: 10 mL

## 2021-05-01 MED ORDER — ACETAMINOPHEN 325 MG PO TABS: 325.0000 mg | ORAL_TABLET | ORAL | Status: DC | PRN

## 2021-05-01 MED ORDER — OXYCODONE HCL 5 MG PO TABS
ORAL_TABLET | ORAL | Status: AC
  Filled 2021-05-01: qty 1

## 2021-05-01 MED ORDER — AMISULPRIDE (ANTIEMETIC) 5 MG/2ML IV SOLN: 10.0000 mg | Freq: Once | INTRAVENOUS | Status: DC | PRN

## 2021-05-01 MED ORDER — SODIUM CHLORIDE 0.9 % IR SOLN
Status: DC | PRN
Start: 2021-05-01 — End: 2021-05-01
  Administered 2021-05-01: 3000 mL

## 2021-05-01 MED ORDER — ACETAMINOPHEN 160 MG/5ML PO SOLN: 325.0000 mg | ORAL | Status: DC | PRN

## 2021-05-01 MED ORDER — EPHEDRINE SULFATE-NACL 50-0.9 MG/10ML-% IV SOSY
PREFILLED_SYRINGE | INTRAVENOUS | Status: DC | PRN
  Administered 2021-05-01: 5 mg via INTRAVENOUS

## 2021-05-01 MED ORDER — FENTANYL CITRATE (PF) 250 MCG/5ML IJ SOLN
INTRAMUSCULAR | Status: DC | PRN
  Administered 2021-05-01: 100 ug via INTRAVENOUS

## 2021-05-01 MED ORDER — SODIUM CHLORIDE (PF) 0.9 % IJ SOLN
INTRAMUSCULAR | Status: AC
  Filled 2021-05-01: qty 10

## 2021-05-01 MED ORDER — ROCURONIUM BROMIDE 10 MG/ML (PF) SYRINGE
PREFILLED_SYRINGE | INTRAVENOUS | Status: DC | PRN
  Administered 2021-05-01: 40 mg via INTRAVENOUS

## 2021-05-01 MED ORDER — LIDOCAINE 2% (20 MG/ML) 5 ML SYRINGE
INTRAMUSCULAR | Status: DC | PRN
  Administered 2021-05-01: 40 mg via INTRAVENOUS

## 2021-05-01 SURGICAL SUPPLY — 41 items
ADH SKN CLS APL DERMABOND .7 (GAUZE/BANDAGES/DRESSINGS) ×2
BAG COUNTER SPONGE SURGICOUNT (BAG) ×3 IMPLANT
BAG SPEC RTRVL LRG 6X4 10 (ENDOMECHANICALS) ×2
BAG SPNG CNTER NS LX DISP (BAG) ×2
CABLE HIGH FREQUENCY MONO STRZ (ELECTRODE) IMPLANT
DERMABOND ADVANCED (GAUZE/BANDAGES/DRESSINGS) ×1
DERMABOND ADVANCED .7 DNX12 (GAUZE/BANDAGES/DRESSINGS) ×2 IMPLANT
DURAPREP 26ML APPLICATOR (WOUND CARE) ×3 IMPLANT
GLOVE SURG ENC MOIS LTX SZ6 (GLOVE) ×3 IMPLANT
GLOVE SURG UNDER LTX SZ6.5 (GLOVE) ×12 IMPLANT
GOWN STRL REUS W/ TWL LRG LVL3 (GOWN DISPOSABLE) ×6 IMPLANT
GOWN STRL REUS W/TWL LRG LVL3 (GOWN DISPOSABLE) ×9
IRRIG SUCT STRYKERFLOW 2 WTIP (MISCELLANEOUS) ×3
IRRIGATION SUCT STRKRFLW 2 WTP (MISCELLANEOUS) ×1 IMPLANT
KIT TURNOVER KIT B (KITS) ×3 IMPLANT
LIGASURE VESSEL 5MM BLUNT TIP (ELECTROSURGICAL) IMPLANT
NDL INSUFFLATION 14GA 120MM (NEEDLE) ×1 IMPLANT
NEEDLE INSUFFLATION 14GA 120MM (NEEDLE) ×3 IMPLANT
PACK LAPAROSCOPY BASIN (CUSTOM PROCEDURE TRAY) ×3 IMPLANT
PACK TRENDGUARD 450 HYBRID PRO (MISCELLANEOUS) ×1 IMPLANT
PACK TRENDGUARD 600 HYBRD PROC (MISCELLANEOUS) IMPLANT
POUCH LAPAROSCOPIC INSTRUMENT (MISCELLANEOUS) ×3 IMPLANT
POUCH SPECIMEN RETRIEVAL 10MM (ENDOMECHANICALS) ×2 IMPLANT
PROTECTOR NERVE ULNAR (MISCELLANEOUS) ×6 IMPLANT
SEALER TISSUE G2 CVD JAW 35 (ENDOMECHANICALS) IMPLANT
SEALER TISSUE G2 CVD JAW 45CM (ENDOMECHANICALS)
SHEARS HARMONIC ACE PLUS 36CM (ENDOMECHANICALS) ×2 IMPLANT
SLEEVE ADV FIXATION 5X100MM (TROCAR) ×4 IMPLANT
SUT VIC AB 3-0 PS2 18 (SUTURE) ×2 IMPLANT
SUT VICRYL 0 UR6 27IN ABS (SUTURE) ×2 IMPLANT
SUT VICRYL 4-0 PS2 18IN ABS (SUTURE) ×3 IMPLANT
SYR 30ML LL (SYRINGE) IMPLANT
SYSTEM CARTER THOMASON II (TROCAR) ×2 IMPLANT
TOWEL GREEN STERILE FF (TOWEL DISPOSABLE) ×6 IMPLANT
TRAY FOLEY W/BAG SLVR 14FR (SET/KITS/TRAYS/PACK) ×3 IMPLANT
TRENDGUARD 450 HYBRID PRO PACK (MISCELLANEOUS) ×3
TRENDGUARD 600 HYBRID PROC PK (MISCELLANEOUS)
TROCAR ADV FIXATION 5X100MM (TROCAR) ×3 IMPLANT
TROCAR XCEL NON-BLD 11X100MML (ENDOMECHANICALS) ×2 IMPLANT
TROCAR XCEL NON-BLD 5MMX100MML (ENDOMECHANICALS) ×6 IMPLANT
WARMER LAPAROSCOPE (MISCELLANEOUS) ×3 IMPLANT

## 2021-05-01 NOTE — Op Note (Signed)
Dana Morris PROCEDURE DATE: 05/01/2021  PREOPERATIVE DIAGNOSIS: Ruptured ectopic pregnancy POSTOPERATIVE DIAGNOSIS: Ruptured right fallopian tube ectopic pregnancy PROCEDURE: Laparoscopic right salpingectomy and removal of ectopic pregnancy SURGEON:  Dr. Milas Hock ANESTHESIOLOGY TEAM: Anesthesiologist: Shelton Silvas, MD CRNA: Junious Silk, CRNA; Molli Hazard, CRNA  INDICATIONS: 36 y.o. G2P0010 at [redacted]w[redacted]d here with the preoperative diagnoses as listed above.  Please refer to preoperative notes for more details. Patient was counseled regarding need for laparoscopic salpingectomy. Risks of surgery including bleeding which may require transfusion or reoperation, infection, injury to bowel or other surrounding organs, need for additional procedures including laparotomy and other postoperative/anesthesia complications were explained to patient.  Written informed consent was obtained.  FINDINGS:  50cc amount of hemoperitoneum.  Dilated right fallopian tube containing ectopic gestation. Small normal appearing uterus, normal left fallopian tube, right ovary and left ovary.  ANESTHESIA: General, 1% bupivicaine local INTRAVENOUS FLUIDS: 1000 ml ESTIMATED BLOOD LOSS: 5 ml URINE OUTPUT: 100 ml SPECIMENS: Right fallopian tube containing ectopic gestation COMPLICATIONS: None immediate  PROCEDURE IN DETAIL:  The patient was taken to the operating room where general anesthesia was administered and was found to be adequate.  She was placed in the dorsal lithotomy position, and was prepped and draped in a sterile manner.  A Foley catheter was inserted into her bladder and attached to constant drainage and a uterine manipulator was then advanced into the uterus .    She was prepped and draped in the usual sterile fashion in the dorsal lithotomy position. A skin incision was made with the 11 blade scalpel in the umbilicus. I entered using the closed technique with the Veress needle. Opening pressure  was 3.   Pneumoperitoneum achieved to a pressure of 15. A 5 mm Optiview port with the camera was used to enter the cavity under direct visualization. Below the point of entry and the pelvis was inspected and there was no evidence of injury. Hemoperitoneum noted in the pelvis. She was placed in steep Trendelenburg.  The scalpel was then used to make two incisions - one suprapubic and one in the LLQ.  The left was 75mm and suprapubic was 12mm port.   The Nezhat suction irrigator was then used to suction the hemoperitoneum and irrigate the pelvis.  Attention was then turned to the right fallopian tube which was grasped and ligated from the underlying mesosalpinx and uterine attachment using the Harmonic instrument.  Good hemostasis was noted.  The specimen was placed in an EndoCatch bag and removed from the abdomen intact. The carter thompson was used to close the suprapubic fascia using 0 vicryl. The abdomen was desufflated, and all instruments were removed.    The incisions were then closed in a subcuticular fashion with 4-0 monocryl and dressings were placed. She was taken to recovery in stable condition.  Rh status: positive   The patient will be discharged to home as per PACU criteria.  Routine postoperative instructions given.  She was prescribed Percocet, Ibuprofen and Colace.    She will follow up in the office in about 2-3 weeks for postoperative evaluation.   Milas Hock MD, FACOG Obstetrician & Gynecologist, [facility] for [employer], [facility]

## 2021-05-01 NOTE — MAU Note (Signed)
Report given to [facility] in short stay

## 2021-05-01 NOTE — MAU Provider Note (Signed)
History     CSN: [id number]  Arrival date and time: 05/01/21 1442   Event Date/Time   First Provider Initiated Contact with Patient 05/01/21 1456      Chief Complaint  Patient presents with   Follow-up   Abdominal Pain   Vaginal Bleeding   HPI Dana Morris is a 36 y.o. G2P0010 at [redacted]w[redacted]d who presents to MAU for repeat Quant hCG following inappropriate rise and concern for ectopic pregnancy.  Patient c/o RLQ abdominal pain, onset of current episode on Thursday night 04/29/2021. Pain score 9/10. She is unable to sit up straight in MAU triage. She denies aggravating or alleviating factors. She has not taken medication or tried other treatments for this complaint.  Patient also endorses ongoing vaginal spotting, recurrent since her MAU evaluation on 04/27/2021.  OB History     Gravida  2   Para      Term      Preterm      AB  1   Living         SAB  1   IAB      Ectopic      Multiple      Live Births              Past Medical History:  Diagnosis Date   Abscess    Asthma    Bronchitis     Past Surgical History:  Procedure Laterality Date   IRRIGATION AND DEBRIDEMENT ABSCESS Right 08/06/2018   Procedure: IRRIGATION AND DEBRIDEMENT AXILLARY ABSCESS;  Surgeon: Berna Bue, MD;  Location: [facility];  Service: General;  Laterality: Right;    Family History  Problem Relation Age of Onset   Hypertension Mother    Diabetes Maternal Uncle     Social History   Tobacco Use   Smoking status: Former    Types: Cigarettes   Smokeless tobacco: Never  Vaping Use   Vaping Use: Never used  Substance Use Topics   Alcohol use: Not Currently   Drug use: Not Currently    Allergies: No Known Allergies  Medications Prior to Admission  Medication Sig Dispense Refill Last Dose   albuterol (VENTOLIN HFA) 108 (90 Base) MCG/ACT inhaler Inhale 1-2 puffs into the lungs every 6 (six) hours as needed for wheezing or shortness of breath. 6.7 g 1    Prenatal  Vit-Fe Fumarate-FA (PRENATAL MULTIVITAMIN) TABS tablet Take 1 tablet by mouth daily at 12 noon.       Review of Systems  Gastrointestinal:  Positive for abdominal pain.  Genitourinary:  Positive for vaginal bleeding.  All other systems reviewed and are negative. Physical Exam   Blood pressure 113/76, pulse 94, temperature 98.1 F (36.7 C), temperature source Oral, resp. rate 16, height 5\' 7"  (1.702 m), weight 100.6 kg, last menstrual period 03/22/2021, SpO2 100 %.  Physical Exam Vitals and nursing note reviewed. Exam conducted with a chaperone present.  Constitutional:      Appearance: She is well-developed. She is ill-appearing.  Cardiovascular:     Rate and Rhythm: Normal rate.     Heart sounds: Normal heart sounds.  Pulmonary:     Effort: Pulmonary effort is normal.     Breath sounds: Normal breath sounds.  Neurological:     Mental Status: She is alert.    MAU Course  Procedures  --Pain score reduced form 9/10 to 7/10 as of 1645  Orders Placed This Encounter  Procedures   03/24/2021 OB Transvaginal   hCG, quantitative,  pregnancy   CBC   Comprehensive metabolic panel   Diet NPO time specified   Results for orders placed or performed during the hospital encounter of 05/01/21 (from the past 24 hour(s))  hCG, quantitative, pregnancy     Status: Abnormal   Collection Time: 05/01/21  2:56 PM  Result Value Ref Range   hCG, Beta Chain, Quant, S 819 (H) <5 mIU/mL  Comprehensive metabolic panel     Status: Abnormal   Collection Time: 05/01/21  2:56 PM  Result Value Ref Range   Sodium 134 (L) 135 - 145 mmol/L   Potassium 3.9 3.5 - 5.1 mmol/L   Chloride 106 98 - 111 mmol/L   CO2 23 22 - 32 mmol/L   Glucose, Bld 110 (H) 70 - 99 mg/dL   BUN 6 6 - 20 mg/dL   Creatinine, Ser 3.35 0.44 - 1.00 mg/dL   Calcium 8.8 (L) 8.9 - 10.3 mg/dL   Total Protein 6.5 6.5 - 8.1 g/dL   Albumin 3.8 3.5 - 5.0 g/dL   AST 25 15 - 41 U/L   ALT 30 0 - 44 U/L   Alkaline Phosphatase 42 38 - 126 U/L    Total Bilirubin 0.9 0.3 - 1.2 mg/dL   GFR, Estimated >45 >62 mL/min   Anion gap 5 5 - 15  CBC     Status: None   Collection Time: 05/01/21  3:04 PM  Result Value Ref Range   WBC 7.0 4.0 - 10.5 K/uL   RBC 4.11 3.87 - 5.11 MIL/uL   Hemoglobin 12.4 12.0 - 15.0 g/dL   HCT 56.3 89.3 - 73.4 %   MCV 89.8 80.0 - 100.0 fL   MCH 30.2 26.0 - 34.0 pg   MCHC 33.6 30.0 - 36.0 g/dL   RDW 28.7 68.1 - 15.7 %   Platelets 288 150 - 400 K/uL   nRBC 0.0 0.0 - 0.2 %   US OB Transvaginal  Result Date: 05/01/2021 CLINICAL DATA:  Inappropriate change in beta HCG levels. EXAM: TRANSVAGINAL OB ULTRASOUND TECHNIQUE: Transvaginal ultrasound was performed for complete evaluation of the gestation as well as the maternal uterus, adnexal regions, and pelvic cul-de-sac. COMPARISON:  None. FINDINGS: Endometrium is normal in thickness, measuring 11 mm. No mass or free fluid within the endometrial canal. No intrauterine gestational sac. Subchorionic hemorrhage:  None visualized. Maternal uterus/adnexae: Mass within the RIGHT adnexa, with peripheral vascular ring, consistent with tubal ectopic pregnancy. The mass is separate from the adjacent RIGHT ovary which appears normal. Small amount of mildly complex free fluid in the RIGHT adnexal region. LEFT ovary appears normal and there is no mass or free fluid seen within the LEFT adnexal region. IMPRESSION: 1. Findings consistent with tubal ectopic pregnancy. Small amount of complex-appearing free fluid in the RIGHT adnexal region, concerning for early rupture of ectopic. 2. No intrauterine pregnancy. Critical Value/emergent results were called by telephone at the time of interpretation on 05/01/2021 at 5:05 pm to provider [facility] , who verbally acknowledged these results. Electronically Signed   By: Bary Richard M.D.   On: 05/01/2021 17:05  '  Assessment and Plan  --36 y.o. G2P0010 at [redacted]w[redacted]d by LMP --Inapproriate change in quant hCG --Concern for early rupture right  adnexa --Dr. Para March in unit to counsel patient  Calvert Cantor, CNM 05/01/2021, 5:21 PM

## 2021-05-01 NOTE — Anesthesia Procedure Notes (Signed)
Procedure Name: Intubation Date/Time: 05/01/2021 6:48 PM Performed by: Valetta Fuller, CRNA Pre-anesthesia Checklist: Patient identified, Emergency Drugs available, Suction available and Patient being monitored Patient Re-evaluated:Patient Re-evaluated prior to induction Oxygen Delivery Method: Circle system utilized Preoxygenation: Pre-oxygenation with 100% oxygen Induction Type: IV induction, Rapid sequence and Cricoid Pressure applied Laryngoscope Size: Miller and 2 Grade View: Grade I Tube type: Oral Tube size: 7.0 mm Number of attempts: 1 Airway Equipment and Method: Stylet Placement Confirmation: ETT inserted through vocal cords under direct vision, positive ETCO2 and breath sounds checked- equal and bilateral Secured at: 21 cm Tube secured with: Tape Dental Injury: Teeth and Oropharynx as per pre-operative assessment

## 2021-05-01 NOTE — H&P (Signed)
[facility] Attending History and Physical  Dana Morris is a 36 y.o. G2P0010 at [redacted]w[redacted]d who presented to MAU today for evaluation of pregnancy of unknown location. She came in for scheduled beta but noted severe pain on the right side 9/10. It is no 7/10 following tylenol but still severe and she is splinting to her side. She notes only spotting for bleeding.   Betas and [place]:  1/26 Hcg: 262, no iup/eup by [place] 1/28 Hcg: 732 1/31 Hcg: 1177 2/2  Hcg: 1205 2/4: 819   Past Medical History:  Diagnosis Date   Abscess    Asthma    Bronchitis    Past Surgical History:  Procedure Laterality Date   IRRIGATION AND DEBRIDEMENT ABSCESS Right 08/06/2018   Procedure: IRRIGATION AND DEBRIDEMENT AXILLARY ABSCESS;  Surgeon: Clovis Riley, MD;  Location: [facility];  Service: General;  Laterality: Right;   OB History  Gravida Para Term Preterm AB Living  2       1    SAB IAB Ectopic Multiple Live Births  1            # Outcome Date GA Lbr Len/2nd Weight Sex Delivery Anes PTL Lv  2 Current           1 SAB 01/2021 [redacted]w[redacted]d         Patient denies any other pertinent gynecologic issues.  No current facility-administered medications on file prior to encounter.   Current Outpatient Medications on File Prior to Encounter  Medication Sig Dispense Refill   albuterol (VENTOLIN HFA) 108 (90 Base) MCG/ACT inhaler Inhale 1-2 puffs into the lungs every 6 (six) hours as needed for wheezing or shortness of breath. 6.7 g 1   Prenatal Vit-Fe Fumarate-FA (PRENATAL MULTIVITAMIN) TABS tablet Take 1 tablet by mouth daily at 12 noon.     No Known Allergies  Social History:   reports that she has quit smoking. Her smoking use included cigarettes. She has never used smokeless tobacco. She reports that she does not currently use alcohol. She reports that she does not currently use drugs. Family History  Problem Relation Age of Onset   Hypertension Mother    Diabetes Maternal Uncle      Review of Systems: Pertinent items noted in HPI and remainder of comprehensive ROS otherwise negative.  PHYSICAL EXAM: Blood pressure 113/76, pulse 94, temperature 98.1 F (36.7 C), temperature source Oral, resp. rate 16, height 5\' 7"  (1.702 m), weight 100.6 kg, last menstrual period 03/22/2021, SpO2 100 %. CONSTITUTIONAL: Well-developed, well-nourished female in no acute distress.  HENT:  Normocephalic, atraumatic, External right and left ear normal. Oropharynx is clear and moist EYES: Conjunctivae and EOM are normal. Pupils are equal, round, and reactive to light. No scleral icterus.  NECK: Normal range of motion, supple, no masses SKIN: Skin is warm and dry. No rash noted. Not diaphoretic. No erythema. No pallor. NEUROLOGIC: Alert and oriented to person, place, and time. Normal reflexes, muscle tone coordination. No cranial nerve deficit noted. PSYCHIATRIC: Normal mood and affect. Normal behavior. Normal judgment and thought content. CARDIOVASCULAR: Normal heart rate noted, regular rhythm RESPIRATORY: Effort and breath sounds normal, no problems with respiration noted ABDOMEN: Soft, nontender, nondistended. PELVIC: Deferred  MUSCULOSKELETAL: Normal range of motion. No tenderness.  No cyanosis, clubbing, or edema.  2+ distal pulses.  Labs: Results for orders placed or performed during the hospital encounter of 05/01/21 (from the past 336 hour(s))  hCG, quantitative, pregnancy   Collection Time: 05/01/21  2:56 PM  Result Value Ref Range   hCG, Beta Chain, Quant, S 819 (H) <5 mIU/mL  Comprehensive metabolic panel   Collection Time: 05/01/21  2:56 PM  Result Value Ref Range   Sodium 134 (L) 135 - 145 mmol/L   Potassium 3.9 3.5 - 5.1 mmol/L   Chloride 106 98 - 111 mmol/L   CO2 23 22 - 32 mmol/L   Glucose, Bld 110 (H) 70 - 99 mg/dL   BUN 6 6 - 20 mg/dL   Creatinine, Ser 0.83 0.44 - 1.00 mg/dL   Calcium 8.8 (L) 8.9 - 10.3 mg/dL   Total Protein 6.5 6.5 - 8.1 g/dL   Albumin 3.8  3.5 - 5.0 g/dL   AST 25 15 - 41 U/L   ALT 30 0 - 44 U/L   Alkaline Phosphatase 42 38 - 126 U/L   Total Bilirubin 0.9 0.3 - 1.2 mg/dL   GFR, Estimated >60 >60 mL/min   Anion gap 5 5 - 15  CBC   Collection Time: 05/01/21  3:04 PM  Result Value Ref Range   WBC 7.0 4.0 - 10.5 K/uL   RBC 4.11 3.87 - 5.11 MIL/uL   Hemoglobin 12.4 12.0 - 15.0 g/dL   HCT 36.9 36.0 - 46.0 %   MCV 89.8 80.0 - 100.0 fL   MCH 30.2 26.0 - 34.0 pg   MCHC 33.6 30.0 - 36.0 g/dL   RDW 13.2 11.5 - 15.5 %   Platelets 288 150 - 400 K/uL   nRBC 0.0 0.0 - 0.2 %  Results for orders placed or performed during the hospital encounter of 04/29/21 (from the past 336 hour(s))  hCG, quantitative, pregnancy   Collection Time: 04/29/21  5:19 PM  Result Value Ref Range   hCG, Beta Chain, Quant, S 1,205 (H) <5 mIU/mL  Results for orders placed or performed during the hospital encounter of 04/27/21 (from the past 336 hour(s))  CBC   Collection Time: 04/27/21  6:42 PM  Result Value Ref Range   WBC 6.5 4.0 - 10.5 K/uL   RBC 4.20 3.87 - 5.11 MIL/uL   Hemoglobin 12.6 12.0 - 15.0 g/dL   HCT 37.5 36.0 - 46.0 %   MCV 89.3 80.0 - 100.0 fL   MCH 30.0 26.0 - 34.0 pg   MCHC 33.6 30.0 - 36.0 g/dL   RDW 13.2 11.5 - 15.5 %   Platelets 316 150 - 400 K/uL   nRBC 0.0 0.0 - 0.2 %  hCG, quantitative, pregnancy   Collection Time: 04/27/21  6:42 PM  Result Value Ref Range   hCG, Beta Chain, Quant, S 1,177 (H) <5 mIU/mL  Results for orders placed or performed during the hospital encounter of 04/24/21 (from the past 336 hour(s))  hCG, quantitative, pregnancy   Collection Time: 04/24/21 12:05 PM  Result Value Ref Range   hCG, Beta Chain, Quant, S 732 (H) <5 mIU/mL  Results for orders placed or performed during the hospital encounter of 04/22/21 (from the past 336 hour(s))  Pregnancy, urine POC   Collection Time: 04/22/21 11:40 AM  Result Value Ref Range   Preg Test, Ur POSITIVE (A) NEGATIVE  Urinalysis, Routine w reflex microscopic Urine,  Clean Catch   Collection Time: 04/22/21 11:45 AM  Result Value Ref Range   Color, Urine YELLOW YELLOW   APPearance CLEAR CLEAR   Specific Gravity, Urine 1.009 1.005 - 1.030   pH 5.0 5.0 - 8.0   Glucose, UA NEGATIVE NEGATIVE mg/dL   Hgb urine  dipstick SMALL (A) NEGATIVE   Bilirubin Urine NEGATIVE NEGATIVE   Ketones, ur NEGATIVE NEGATIVE mg/dL   Protein, ur NEGATIVE NEGATIVE mg/dL   Nitrite NEGATIVE NEGATIVE   Leukocytes,Ua NEGATIVE NEGATIVE   RBC / HPF 0-5 0 - 5 RBC/hpf   WBC, UA 0-5 0 - 5 WBC/hpf   Bacteria, UA RARE (A) NONE SEEN   Squamous Epithelial / LPF 0-5 0 - 5  GC/Chlamydia probe amp ([place])not at [facility]   Collection Time: 04/22/21 11:51 AM  Result Value Ref Range   Neisseria Gonorrhea Negative    Chlamydia Negative    Comment Normal Reference [place] Chlamydia - Negative    Comment      Normal Reference Range Neisseria Gonorrhea - Negative  CBC   Collection Time: 04/22/21 11:59 AM  Result Value Ref Range   WBC 6.1 4.0 - 10.5 K/uL   RBC 4.34 3.87 - 5.11 MIL/uL   Hemoglobin 12.9 12.0 - 15.0 g/dL   HCT 38.4 36.0 - 46.0 %   MCV 88.5 80.0 - 100.0 fL   MCH 29.7 26.0 - 34.0 pg   MCHC 33.6 30.0 - 36.0 g/dL   RDW 12.9 11.5 - 15.5 %   Platelets 300 150 - 400 K/uL   nRBC 0.0 0.0 - 0.2 %  hCG, quantitative, pregnancy   Collection Time: 04/22/21 11:59 AM  Result Value Ref Range   hCG, Beta Chain, Quant, S 262 (H) <5 mIU/mL  Wet prep, genital   Collection Time: 04/22/21 12:12 PM   Specimen: Vaginal  Result Value Ref Range   Yeast Wet Prep HPF POC NONE SEEN NONE SEEN   Trich, Wet Prep NONE SEEN NONE SEEN   Clue Cells Wet Prep HPF POC PRESENT (A) NONE SEEN   WBC, Wet Prep HPF POC <10 <10   Sperm NONE SEEN     Imaging Studies: DG Chest 2 View  Result Date: 04/13/2021 CLINICAL DATA:  Shortness of breath, posterior chest pain EXAM: CHEST - 2 VIEW COMPARISON:  Chest radiograph 09/16/2020 FINDINGS: The cardiomediastinal silhouette is normal. There is no focal  consolidation or pulmonary edema. There is no pleural effusion or pneumothorax. There is no acute osseous abnormality. IMPRESSION: No radiographic evidence of acute cardiopulmonary process. Electronically Signed   By: Valetta Mole M.D.   On: 04/13/2021 14:38   US OB Transvaginal  Result Date: 05/01/2021 CLINICAL DATA:  Inappropriate change in beta HCG levels. EXAM: TRANSVAGINAL OB ULTRASOUND TECHNIQUE: Transvaginal ultrasound was performed for complete evaluation of the gestation as well as the maternal uterus, adnexal regions, and pelvic cul-de-sac. COMPARISON:  None. FINDINGS: Endometrium is normal in thickness, measuring 11 mm. No mass or free fluid within the endometrial canal. No intrauterine gestational sac. Subchorionic hemorrhage:  None visualized. Maternal uterus/adnexae: Mass within the RIGHT adnexa, with peripheral vascular ring, consistent with tubal ectopic pregnancy. The mass is separate from the adjacent RIGHT ovary which appears normal. Small amount of mildly complex free fluid in the RIGHT adnexal region. LEFT ovary appears normal and there is no mass or free fluid seen within the LEFT adnexal region. IMPRESSION: 1. Findings consistent with tubal ectopic pregnancy. Small amount of complex-appearing free fluid in the RIGHT adnexal region, concerning for early rupture of ectopic. 2. No intrauterine pregnancy. Critical Value/emergent results were called by telephone at the time of interpretation on 05/01/2021 at 5:05 pm to provider [facility] , who verbally acknowledged these results. Electronically Signed   By: Franki Cabot M.D.   On: 05/01/2021 17:05  US OB LESS THAN 14 WEEKS WITH OB TRANSVAGINAL  Result Date: 04/22/2021 CLINICAL DATA:  Low pelvic pain x1 day worse today. Quantitative beta hCG of 262. EXAM: OBSTETRIC <14 WK ULTRASOUND TECHNIQUE: Transabdominal ultrasound was performed for evaluation of the gestation as well as the maternal uterus and adnexal regions. COMPARISON:  None.  FINDINGS: Intrauterine gestational sac: None Yolk sac:  Not Visualized. Embryo:  Not Visualized. Cardiac Activity: Not Visualized. Subchorionic hemorrhage:  None visualized. Maternal uterus/adnexae: Within the left adnexa there is a thick walled vascular lesion measuring 1.5 x 1.3 x 1.1 cm, which can not be sonographically displaced from the left ovary and is most likely located within the ovary itself, indicating it is most likely a corpus luteum. There is endometrial thickening. Right ovary is unremarkable. IMPRESSION: Findings most consistent with pregnancy of unknown location. No intrauterine gestational sac. While there is a thick walled heterogeneous vascular structure in the left adnexa this appears to be located within the left ovary and is most consistent with a corpus luteum, although an ovarian ectopic pregnancy can not be completely excluded on this study it is felt significantly less likely. Sonographic differential diagnosis includes intrauterine gestation too early to visualize, spontaneous abortion or ectopic gestation. Recommend close clinical follow-up and serial serum beta HCG monitoring, with repeat obstetric scan as warranted by beta HCG levels and clinical assessment. Electronically Signed   By: Dahlia Bailiff M.D.   On: 04/22/2021 13:28    Assessment: Active Problems:   Ruptured ectopic pregnancy  Plan: - It is either tubal pregnancy that is rupturing but also possible that it is starting to come out the tube. If already come out the tube and no bleeding, no additional intervention would be warranted. Discussed that if the tube is ruptured, I will have to remove the tube in its entirety. Reviewed the other tube will continue to collect eggs from either ovary so every month she would have the opportunity to get pregnant.  - She has no other identifiable risk factors for ectopic pregnancy for further risk reduction - Discussed risks of surgery of Laparoscopic removal of ectopic  pregnancy.  - Risks of surgery include but are not limited to: bleeding, infection, injury to surrounding organs/tissues (i.e. bowel/bladder/ureters), need for additional procedures, wound complications, hospital re-admission, and conversion to open surgery - We discussed postop restrictions, precautions and expectations - All questions answered, informed consent filled out by myself and pt signed - Will get adequate IV access and perform T&S - Discussed anticipation of home after surgery    Radene Gunning, MD, [place], [facility] for [place]

## 2021-05-01 NOTE — MAU Note (Signed)
TEMARA LANUM is a 36 y.o. at [redacted]w[redacted]d here in MAU reporting: here for follow up hcg. Is having abdominal pain since Thursday night. Pain is on the right side and in the middle. Is also having some spotting.   Onset of complaint: ongoing  Pain score: 9/10  Vitals:   05/01/21 1451  BP: 113/76  Pulse: 94  Resp: 16  Temp: 98.1 F (36.7 C)  SpO2: 100%     Lab orders placed from triage: hcg

## 2021-05-01 NOTE — Anesthesia Preprocedure Evaluation (Addendum)
Anesthesia Evaluation  Patient identified by MRN, date of birth, ID band Patient awake    Reviewed: Allergy & Precautions, NPO status , Patient's Chart, lab work & pertinent test results  Airway Mallampati: II  TM Distance: >3 FB Neck ROM: Full    Dental  (+) Teeth Intact, Dental Advisory Given   Pulmonary asthma , former smoker,    breath sounds clear to auscultation       Cardiovascular negative cardio ROS   Rhythm:Regular Rate:Normal     Neuro/Psych negative neurological ROS  negative psych ROS   GI/Hepatic negative GI ROS, Neg liver ROS,   Endo/Other  negative endocrine ROS  Renal/GU negative Renal ROS     Musculoskeletal negative musculoskeletal ROS (+)   Abdominal Normal abdominal exam  (+)   Peds  Hematology negative hematology ROS (+)   Anesthesia Other Findings   Reproductive/Obstetrics                            Anesthesia Physical Anesthesia Plan  ASA: 2  Anesthesia Plan: General   Post-op Pain Management:    Induction: Rapid sequence, Cricoid pressure planned and Intravenous  PONV Risk Score and Plan: 4 or greater and Ondansetron, Dexamethasone, Midazolam and Scopolamine patch - Pre-op  Airway Management Planned: Oral ETT  Additional Equipment: None  Intra-op Plan:   Post-operative Plan: Extubation in OR  Informed Consent: I have reviewed the patients History and Physical, chart, labs and discussed the procedure including the risks, benefits and alternatives for the proposed anesthesia with the patient or authorized representative who has indicated his/her understanding and acceptance.     Dental advisory given  Plan Discussed with: CRNA  Anesthesia Plan Comments: (Lab Results      Component                Value               Date                      WBC                      7.0                 05/01/2021                HGB                      12.4                 05/01/2021                HCT                      36.9                05/01/2021                MCV                      89.8                05/01/2021                PLT                      288  05/01/2021           )       Anesthesia Quick Evaluation

## 2021-05-01 NOTE — Transfer of Care (Signed)
Immediate Anesthesia Transfer of Care Note  Patient: Dana Morris  Procedure(s) Performed: LAPAROSCOPIC RIGHT SALPINGECTOMY WITH REMOVAL OF ECTOPIC PREGNANCY (Right)  Patient Location: PACU  Anesthesia Type:General  Level of Consciousness: awake, alert  and oriented  Airway & Oxygen Therapy: Patient Spontanous Breathing  Post-op Assessment: Report given to RN and Post -op Vital signs reviewed and stable  Post vital signs: Reviewed and stable  Last Vitals:  Vitals Value Taken Time  BP 120/76   Temp 99.5   Pulse 103 05/01/21 1949  Resp 21 05/01/21 1949  SpO2 95 % 05/01/21 1949  Vitals shown include unvalidated device data.  Last Pain:  Vitals:   05/01/21 1803  TempSrc:   PainSc: 7       Patients Stated Pain Goal: 3 (05/01/21 1803)  Complications: No notable events documented.

## 2021-05-01 NOTE — Progress Notes (Signed)

## 2021-05-02 ENCOUNTER — Encounter (HOSPITAL_COMMUNITY): Payer: Self-pay | Admitting: Obstetrics and Gynecology

## 2021-05-02 NOTE — Anesthesia Postprocedure Evaluation (Signed)
Anesthesia Post Note  Patient: Dana Morris  Procedure(s) Performed: LAPAROSCOPIC RIGHT SALPINGECTOMY WITH REMOVAL OF ECTOPIC PREGNANCY (Right)     Patient location during evaluation: PACU Anesthesia Type: General Level of consciousness: awake and alert Pain management: pain level controlled Vital Signs Assessment: post-procedure vital signs reviewed and stable Respiratory status: spontaneous breathing, nonlabored ventilation, respiratory function stable and patient connected to nasal cannula oxygen Cardiovascular status: blood pressure returned to baseline and stable Postop Assessment: no apparent nausea or vomiting Anesthetic complications: no   No notable events documented.  Last Vitals:  Vitals:   05/01/21 2055 05/01/21 2100  BP:    Pulse: 88 86  Resp: (!) 23 (!) 24  Temp: 36.9 C   SpO2: 97% 97%    Last Pain:  Vitals:   05/01/21 2055  TempSrc:   PainSc: 3                  Shelton Silvas

## 2021-05-05 ENCOUNTER — Telehealth: Payer: Self-pay | Admitting: Obstetrics and Gynecology

## 2021-05-05 LAB — SURGICAL PATHOLOGY

## 2021-05-05 NOTE — Telephone Encounter (Signed)
Spoke with Dana Morris. She has recovered well from her surgery. Reviewed pathology with her.   Discussed pregnancy intention - she would like to TTC in the future. Discussed would advised tracking cycles and will late menses would check UPTs. With positive UPT, she should contact the office and we would start serial betas until defined location due to her ectopic history.   All questions answered.   She has appt on 2/14 with Terri. Reviewed if persistent questions following that, she can have f/u appt with me (or other gyne if that works better for her schedule).   Milas Hock, MD Attending Obstetrician & Gynecologist, [facility] for [facility], [facility]

## 2021-05-06 ENCOUNTER — Other Ambulatory Visit: Payer: Self-pay

## 2021-05-11 ENCOUNTER — Other Ambulatory Visit: Payer: Self-pay

## 2021-05-11 ENCOUNTER — Encounter: Payer: Self-pay | Admitting: Nurse Practitioner

## 2021-05-11 ENCOUNTER — Ambulatory Visit (INDEPENDENT_AMBULATORY_CARE_PROVIDER_SITE_OTHER): Payer: Medicaid Other | Admitting: Nurse Practitioner

## 2021-05-11 VITALS — BP 127/84 | HR 85 | Wt 219.6 lb

## 2021-05-11 DIAGNOSIS — O009 Unspecified ectopic pregnancy without intrauterine pregnancy: Secondary | ICD-10-CM

## 2021-05-11 DIAGNOSIS — Z3169 Encounter for other general counseling and advice on procreation: Secondary | ICD-10-CM | POA: Diagnosis not present

## 2021-05-11 MED ORDER — VITAFOL GUMMIES 3.33-0.333-34.8 MG PO CHEW: 3.0000 | CHEWABLE_TABLET | Freq: Every day | ORAL | 11 refills | Status: DC

## 2021-05-12 ENCOUNTER — Encounter: Payer: Self-pay | Admitting: Nurse Practitioner

## 2021-05-12 NOTE — Progress Notes (Signed)
Positive GAD-7; offered [facility]. Patient declined.   Fleet Contras RN 05/12/21

## 2021-05-12 NOTE — Progress Notes (Signed)
GYNECOLOGY OFFICE VISIT NOTE   History:  36 y.o. G2P0010 here today for follow up from surgery for ruptured ectopic. She denies any abnormal vaginal discharge, bleeding, pelvic pain or other concerns.  Previous notes and OR note reviewed.  Past Medical History:  Diagnosis Date   Abscess    Asthma    Bronchitis     Past Surgical History:  Procedure Laterality Date   IRRIGATION AND DEBRIDEMENT ABSCESS Right 08/06/2018   Procedure: IRRIGATION AND DEBRIDEMENT AXILLARY ABSCESS;  Surgeon: Berna Bue, MD;  Location: [facility];  Service: General;  Laterality: Right;   LAPAROSCOPIC UNILATERAL SALPINGECTOMY Right 05/01/2021   Procedure: LAPAROSCOPIC RIGHT SALPINGECTOMY WITH REMOVAL OF ECTOPIC PREGNANCY;  Surgeon: Milas Hock, MD;  Location: [facility] OR;  Service: Gynecology;  Laterality: Right;    The following portions of the patient's history were reviewed and updated as appropriate: allergies, current medications, past family history, past medical history, past social history, past surgical history and problem list.    Review of Systems:  Pertinent items noted in HPI and remainder of comprehensive ROS otherwise negative.  Objective:  Physical Exam BP 127/84    Pulse 85    Wt 219 lb 9.6 oz (99.6 kg)    LMP 03/22/2021 (Approximate)    Breastfeeding Unknown    BMI 34.39 kg/m  CONSTITUTIONAL: Well-developed, well-nourished female in no acute distress.  HENT:  Normocephalic, atraumatic. External right and left ear normal.  EYES: Conjunctivae and EOM are normal. Pupils are equal, round.  No scleral icterus.  NECK: Normal range of motion, supple, no masses SKIN: Skin is warm and dry. No rash noted. Not diaphoretic. No erythema. No pallor. NEUROLOGIC: Alert and oriented to person, place, and time. Normal muscle tone coordination. No cranial nerve deficit noted. PSYCHIATRIC: Normal mood and affect. Normal behavior. Normal judgment and thought content. CARDIOVASCULAR: Normal heart rate  noted RESPIRATORY: Effort and breath sounds normal, no problems with respiration noted ABDOMEN: Soft, no distention noted.   PELVIC: Deferred MUSCULOSKELETAL: Normal range of motion. No edema noted.  Labs and Imaging DG Chest 2 View  Result Date: 04/13/2021 CLINICAL DATA:  Shortness of breath, posterior chest pain EXAM: CHEST - 2 VIEW COMPARISON:  Chest radiograph 09/16/2020 FINDINGS: The cardiomediastinal silhouette is normal. There is no focal consolidation or pulmonary edema. There is no pleural effusion or pneumothorax. There is no acute osseous abnormality. IMPRESSION: No radiographic evidence of acute cardiopulmonary process. Electronically Signed   By: Lesia Hausen M.D.   On: 04/13/2021 14:38   US OB Transvaginal  Result Date: 05/01/2021 CLINICAL DATA:  Inappropriate change in beta HCG levels. EXAM: TRANSVAGINAL OB ULTRASOUND TECHNIQUE: Transvaginal ultrasound was performed for complete evaluation of the gestation as well as the maternal uterus, adnexal regions, and pelvic cul-de-sac. COMPARISON:  None. FINDINGS: Endometrium is normal in thickness, measuring 11 mm. No mass or free fluid within the endometrial canal. No intrauterine gestational sac. Subchorionic hemorrhage:  None visualized. Maternal uterus/adnexae: Mass within the RIGHT adnexa, with peripheral vascular ring, consistent with tubal ectopic pregnancy. The mass is separate from the adjacent RIGHT ovary which appears normal. Small amount of mildly complex free fluid in the RIGHT adnexal region. LEFT ovary appears normal and there is no mass or free fluid seen within the LEFT adnexal region. IMPRESSION: 1. Findings consistent with tubal ectopic pregnancy. Small amount of complex-appearing free fluid in the RIGHT adnexal region, concerning for early rupture of ectopic. 2. No intrauterine pregnancy. Critical Value/emergent results were called by telephone at the  time of interpretation on 05/01/2021 at 5:05 pm to provider [facility] ,  who verbally acknowledged these results. Electronically Signed   By: Bary Richard M.D.   On: 05/01/2021 17:05   US OB LESS THAN 14 WEEKS WITH OB TRANSVAGINAL  Result Date: 04/22/2021 CLINICAL DATA:  Low pelvic pain x1 day worse today. Quantitative beta hCG of 262. EXAM: OBSTETRIC <14 WK ULTRASOUND TECHNIQUE: Transabdominal ultrasound was performed for evaluation of the gestation as well as the maternal uterus and adnexal regions. COMPARISON:  None. FINDINGS: Intrauterine gestational sac: None Yolk sac:  Not Visualized. Embryo:  Not Visualized. Cardiac Activity: Not Visualized. Subchorionic hemorrhage:  None visualized. Maternal uterus/adnexae: Within the left adnexa there is a thick walled vascular lesion measuring 1.5 x 1.3 x 1.1 cm, which can not be sonographically displaced from the left ovary and is most likely located within the ovary itself, indicating it is most likely a corpus luteum. There is endometrial thickening. Right ovary is unremarkable. IMPRESSION: Findings most consistent with pregnancy of unknown location. No intrauterine gestational sac. While there is a thick walled heterogeneous vascular structure in the left adnexa this appears to be located within the left ovary and is most consistent with a corpus luteum, although an ovarian ectopic pregnancy can not be completely excluded on this study it is felt significantly less likely. Sonographic differential diagnosis includes intrauterine gestation too early to visualize, spontaneous abortion or ectopic gestation. Recommend close clinical follow-up and serial serum beta HCG monitoring, with repeat obstetric scan as warranted by beta HCG levels and clinical assessment. Electronically Signed   By: Maudry Mayhew M.D.   On: 04/22/2021 13:28    Assessment & Plan:  1. Ruptured ectopic pregnancy Abdominal incisions well healed Wants to conceive Reviewed grief after miscarriage - currently still saddened - declines in house [facility] but is aware she  can call for an appointment Advised to have heart healing before becoming pregnant again Prescribed prenatal vitamins to take daily Advised no smoking, no drugs, no alcohol Advised to seek medical care as soon as she is aware she is pregnant to be evaluated to rule out ectopic pregnancy.  2. Encounter for preconception consultation See above   Routine preventative health maintenance measures emphasized. Please refer to After Visit Summary for other counseling recommendations.   Return in about 1 year (around 05/11/2022).   Total face-to-face time with patient: 15 minutes.  Over 50% of encounter was spent on counseling and coordination of care.  Nolene Bernheim, RN, MSN, NP-BC Nurse Practitioner, [facility] for [employer], [facility] 05/12/2021 11:31 PM

## 2021-07-24 IMAGING — CT CT MAXILLOFACIAL W/O CM
3 series · 16 of 47 positions shown, 19 images · non-contrast
Comparison: None.

CLINICAL DATA: Level 2 trauma

EXAM:
CT HEAD WITHOUT CONTRAST
CT MAXILLOFACIAL WITHOUT CONTRAST
CT CERVICAL SPINE WITHOUT CONTRAST
TECHNIQUE: Multidetector CT imaging of the head, cervical spine, and
maxillofacial structures were performed using the standard protocol
without intravenous contrast. Multiplanar CT image reconstructions
of the cervical spine and maxillofacial structures were also
generated.

[Series 4: facialbone 2.0 st · axial · 0.38mm/px · z∈[-198,-46]mm · 10 of 90 slices shown, 13 images]
[im 7/90  brain]
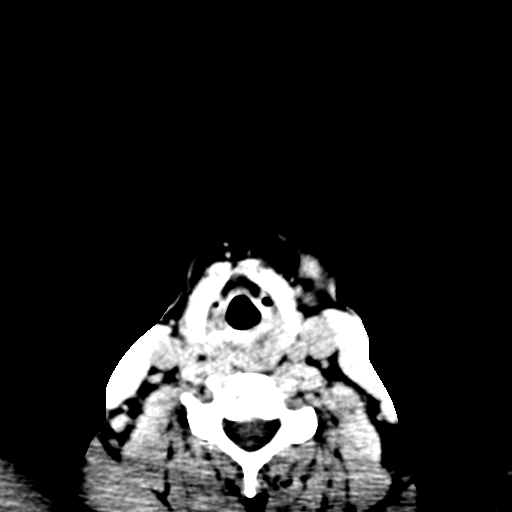
[im 7/90  bone]
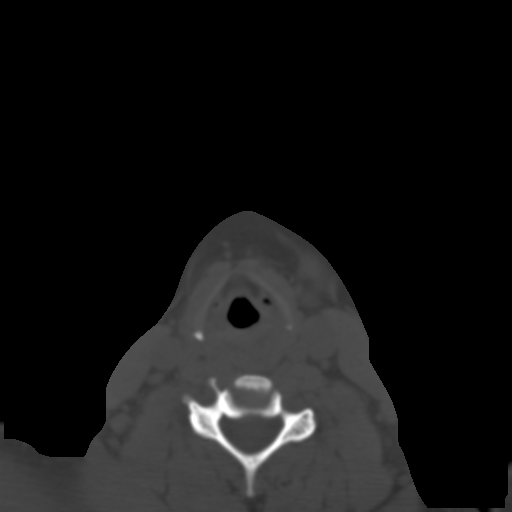
[im 16/90  bone]
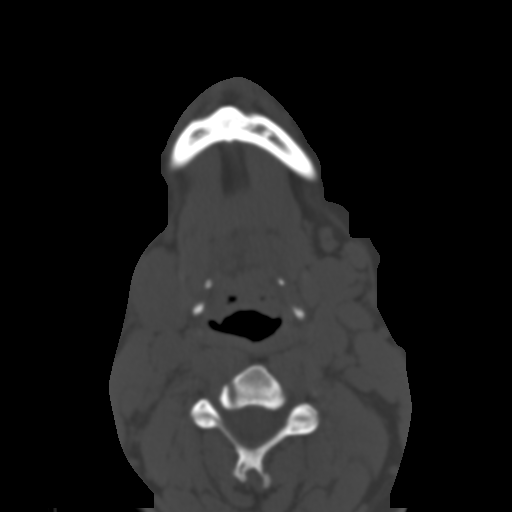
[im 25/90  bone]
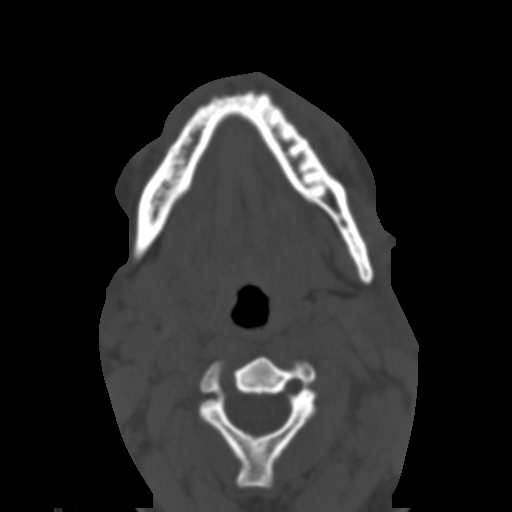
[im 31/90  bone]
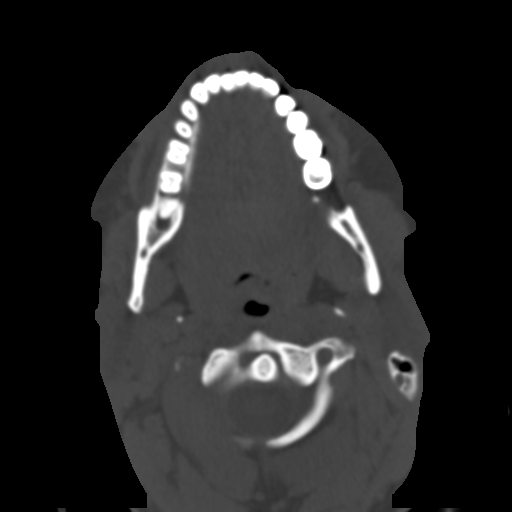
[im 40/90  brain]
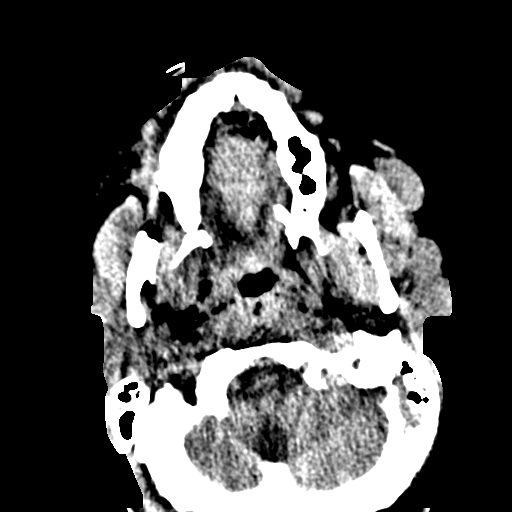
[im 40/90  bone]
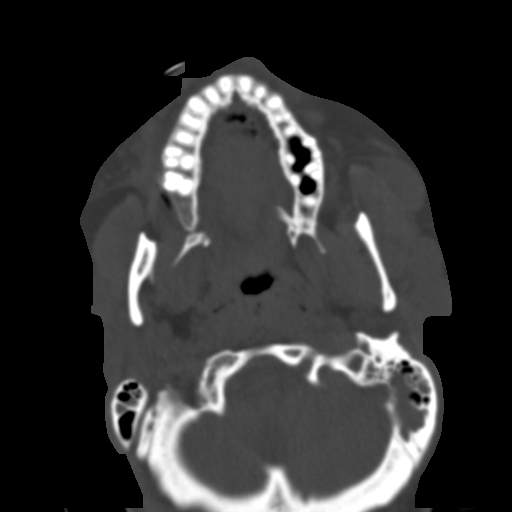
[im 50/90  bone]
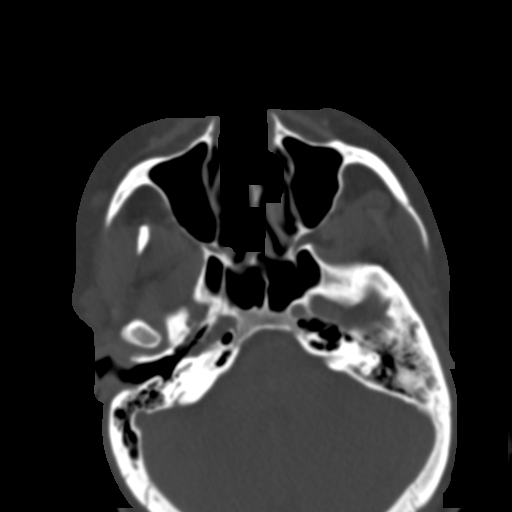
[im 59/90  bone]
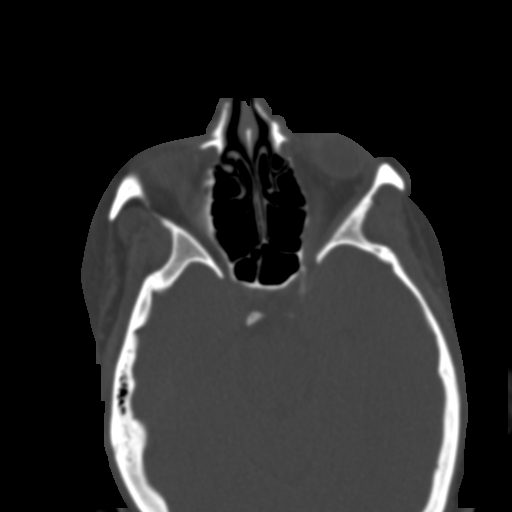
[im 68/90  bone]
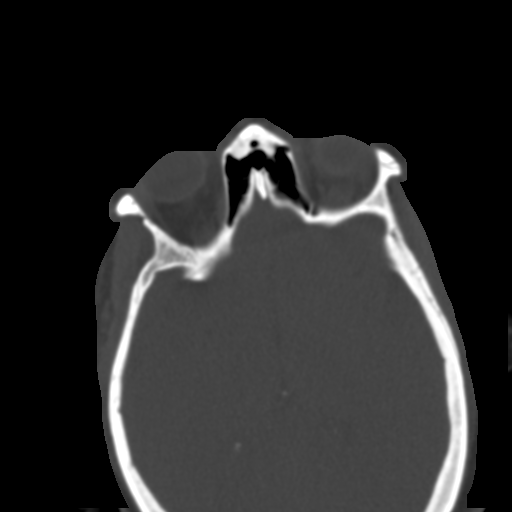
[im 74/90  brain]
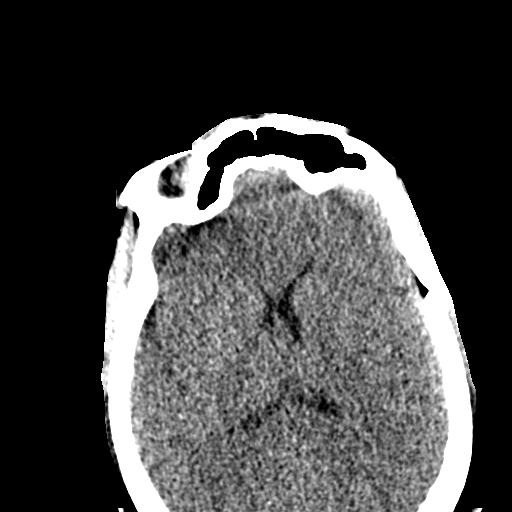
[im 74/90  bone]
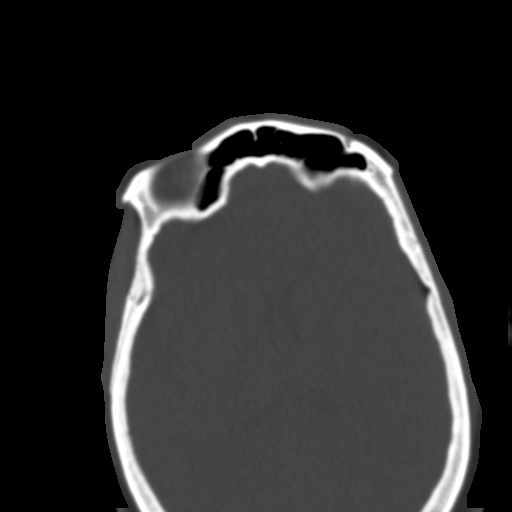
[im 83/90  bone]
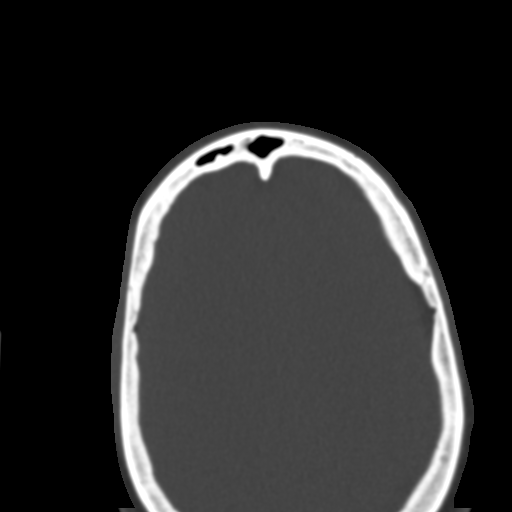

[Series 9: facialbone 2.0 cor st · coronal · 0.38mm/px · 3 of 88 slices shown]
[im 30/88  bone]
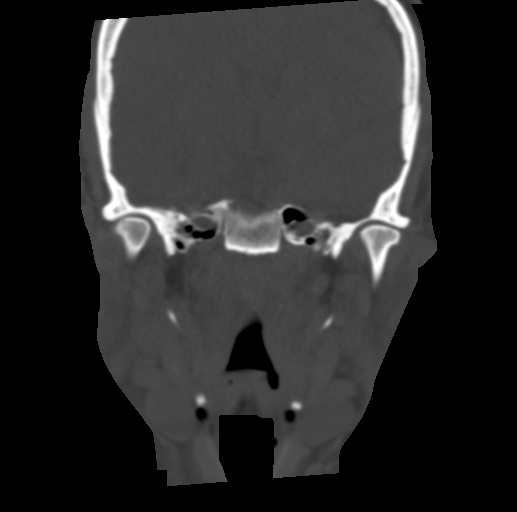
[im 39/88  bone]
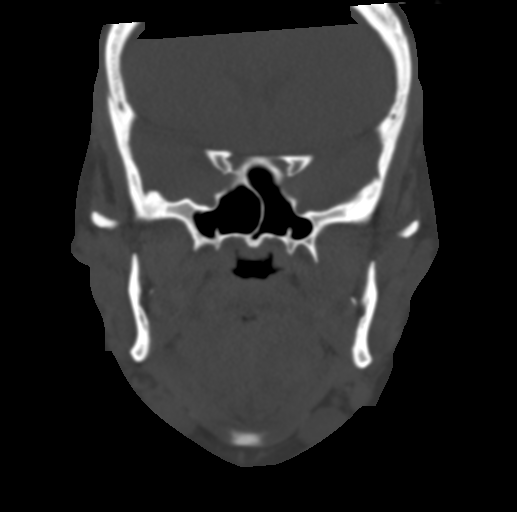
[im 49/88  bone]
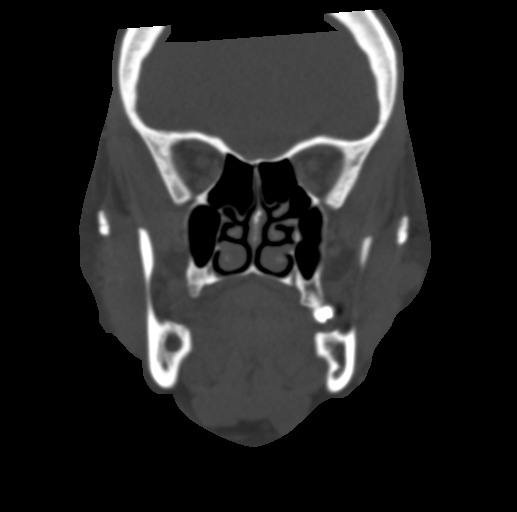

[Series 10: facialbone 2.0 sag st · sagittal · 0.38mm/px · 3 of 76 slices shown]
[im 26/76  bone]
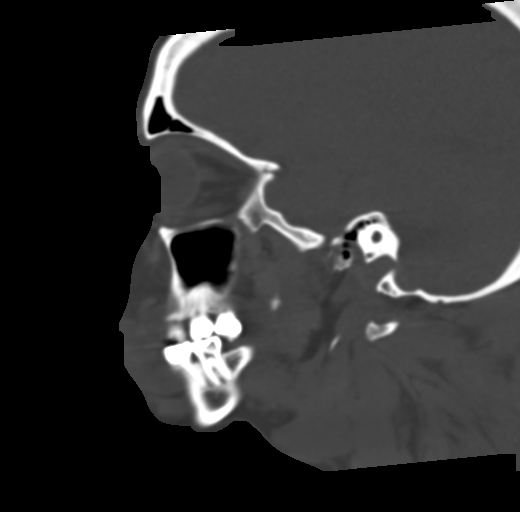
[im 38/76  bone]
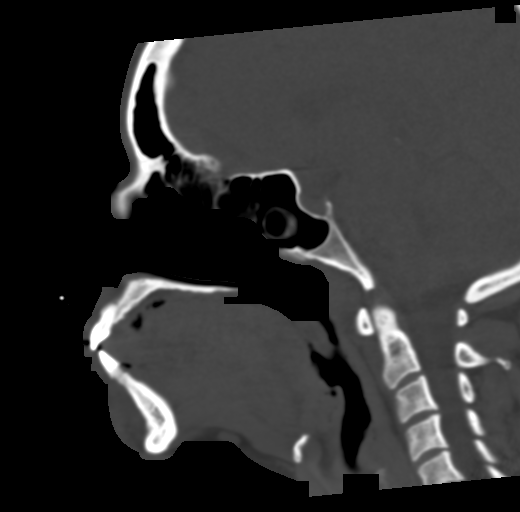
[im 51/76  bone]
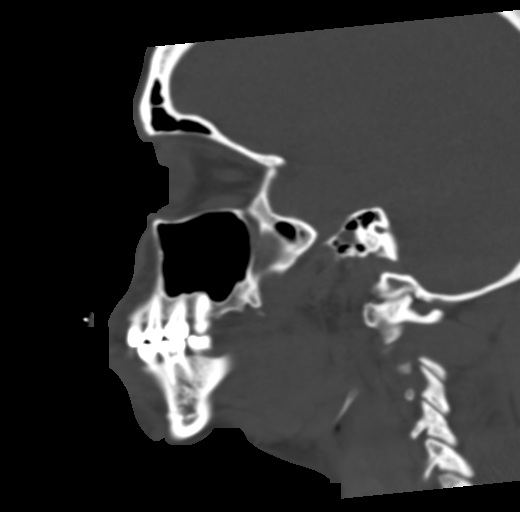

[16 of 47 positions shown; findings below may reference images not displayed]

FINDINGS: CT HEAD FINDINGS

Brain: No evidence of acute infarction, hemorrhage, hydrocephalus,
extra-axial collection or mass lesion/mass effect.

Vascular: No hyperdense vessel or unexpected calcification.

Skull: Normal. Negative for fracture or focal lesion.

Other: Left mastoid opacification without visible fracture. Clear
nasopharynx

CT MAXILLOFACIAL FINDINGS

Osseous: No fracture or mandibular dislocation. Right lower molar
cavity and periapical erosion

Orbits: No visible injury

Sinuses: Negative for hemosinus

Soft tissues: No hematoma.

CT CERVICAL SPINE FINDINGS

Alignment: Normal.

Skull base and vertebrae: No acute fracture. No primary bone lesion
or focal pathologic process.

Soft tissues and spinal canal: No prevertebral fluid or swelling. No
visible canal hematoma.

Disc levels:  No degenerative changes or visible impingement

Upper chest: Negative
IMPRESSION: 1. No evidence of acute intracranial or cervical spine injury.
2. Negative for facial fracture.
3. Left mastoid opacification.

## 2023-03-10 ENCOUNTER — Encounter (HOSPITAL_COMMUNITY): Payer: Self-pay

## 2023-03-10 ENCOUNTER — Ambulatory Visit (HOSPITAL_COMMUNITY)
Admission: EM | Admit: 2023-03-10 | Discharge: 2023-03-10 | Disposition: A | Discharge status: Home / Self Care | Payer: Medicaid Other | Attending: Emergency Medicine | Admitting: Emergency Medicine

## 2023-03-10 DIAGNOSIS — L02412 Cutaneous abscess of left axilla: Secondary | ICD-10-CM

## 2023-03-10 MED ORDER — KETOROLAC TROMETHAMINE 60 MG/2ML IM SOLN
30.0000 mg | Freq: Once | INTRAMUSCULAR | Status: AC
  Administered 2023-03-10: 30 mg via INTRAMUSCULAR

## 2023-03-10 MED ORDER — KETOROLAC TROMETHAMINE 30 MG/ML IJ SOLN
INTRAMUSCULAR | Status: AC
  Filled 2023-03-10: qty 1

## 2023-03-10 MED ORDER — DOXYCYCLINE HYCLATE 100 MG PO CAPS: 100.0000 mg | ORAL_CAPSULE | Freq: Two times a day (BID) | ORAL | 0 refills | Status: DC

## 2023-03-10 MED ORDER — CLINDAMYCIN PHOSPHATE 1 % EX GEL: Freq: Two times a day (BID) | CUTANEOUS | 0 refills | Status: AC

## 2023-03-10 NOTE — Discharge Instructions (Addendum)
You have an abscess in your left axilla, most likely due to your Hidradenitis suppurativa.  Please do warm compresses for 10 to 15 minutes 3 times daily.  You can take 800 mg of ibuprofen every 8 hours starting tomorrow for any pain and inflammation.  Take all antibiotics as prescribed and until finished.  Return to clinic in 72 hours for reassessment, as the area may be ready for incision and drainage.

## 2023-03-10 NOTE — ED Provider Notes (Signed)
[facility]    CSN: [id number] Arrival date & time: 03/10/23  1623      History   Chief Complaint Chief Complaint  Patient presents with   Abscess    HPI Dana Morris is a 37 y.o. female.   Patient presents to clinic for complaints of an abscess in her left axilla.  It has been present and growing for the past 3 days.  She does have a history of HS. she has been doing warm compresses to the area and taking Tylenol.  No drainage.  Reports she will get frequent small abscesses, they are not usually this large.  No fevers.  The area is so painful and tender she is not able to wear a shirt.  The history is provided by the patient and medical records.  Abscess   Past Medical History:  Diagnosis Date   Abscess    Asthma    Bronchitis     Patient Active Problem List   Diagnosis Date Noted   Ruptured ectopic pregnancy 05/01/2021   Hidradenitis suppurativa 08/05/2018    Past Surgical History:  Procedure Laterality Date   IRRIGATION AND DEBRIDEMENT ABSCESS Right 08/06/2018   Procedure: IRRIGATION AND DEBRIDEMENT AXILLARY ABSCESS;  Surgeon: Berna Bue, MD;  Location: [facility];  Service: General;  Laterality: Right;   LAPAROSCOPIC UNILATERAL SALPINGECTOMY Right 05/01/2021   Procedure: LAPAROSCOPIC RIGHT SALPINGECTOMY WITH REMOVAL OF ECTOPIC PREGNANCY;  Surgeon: Milas Hock, MD;  Location: [facility] OR;  Service: Gynecology;  Laterality: Right;    OB History     Gravida  2   Para      Term      Preterm      AB  1   Living         SAB  1   IAB      Ectopic      Multiple      Live Births               Home Medications    Prior to Admission medications   Medication Sig Start Date End Date Taking? Authorizing Provider  clindamycin (CLINDAGEL) 1 % gel Apply topically 2 (two) times daily. 03/10/23  Yes Rinaldo Ratel, [place] N, FNP  doxycycline (VIBRAMYCIN) 100 MG capsule Take 1 capsule (100 mg total) by mouth 2 (two) times daily. 03/10/23   Yes Kasi Lasky, [place] N, FNP    Family History Family History  Problem Relation Age of Onset   Hypertension Mother    Diabetes Maternal Uncle     Social History Social History   Tobacco Use   Smoking status: Former    Types: Cigarettes   Smokeless tobacco: Never  Vaping Use   Vaping status: Never Used  Substance Use Topics   Alcohol use: Not Currently   Drug use: Not Currently     Allergies   Patient has no known allergies.   Review of Systems Review of Systems  Per HPI   Physical Exam Triage Vital Signs ED Triage Vitals  Encounter Vitals Group     BP 03/10/23 1701 (!) 148/112     Systolic BP Percentile --      Diastolic BP Percentile --      Pulse Rate 03/10/23 1701 84     Resp 03/10/23 1701 18     Temp 03/10/23 1701 98.6 F (37 C)     Temp Source 03/10/23 1701 Oral     SpO2 03/10/23 1701 98 %     Weight --  Height --      Head Circumference --      Peak Flow --      Pain Score 03/10/23 1702 10     Pain Loc --      Pain Education --      Exclude from Growth Chart --    No data found.  Updated Vital Signs BP (!) 148/112 (BP Location: Right Arm)   Pulse 84   Temp 98.6 F (37 C) (Oral)   Resp 18   LMP 02/25/2023   SpO2 98%   Breastfeeding No   Visual Acuity Right Eye Distance:   Left Eye Distance:   Bilateral Distance:    Right Eye Near:   Left Eye Near:    Bilateral Near:     Physical Exam Vitals and nursing note reviewed.  Constitutional:      Appearance: Normal appearance.  HENT:     Head: Normocephalic and atraumatic.     Right Ear: External ear normal.     Left Ear: External ear normal.     Nose: Nose normal.     Mouth/Throat:     Mouth: Mucous membranes are moist.  Eyes:     Conjunctiva/sclera: Conjunctivae normal.  Cardiovascular:     Rate and Rhythm: Normal rate.  Pulmonary:     Effort: Pulmonary effort is normal. No respiratory distress.  Musculoskeletal:        General: Normal range of motion.  Skin:     General: Skin is warm and dry.     Findings: Abscess present.     Comments: 6 cm x 4 cm indurated abscess to left axilla. Previous tracking and scars visible.   Neurological:     General: No focal deficit present.     Mental Status: She is alert and oriented to person, place, and time.  Psychiatric:        Mood and Affect: Mood normal.        Behavior: Behavior normal. Behavior is cooperative.      UC Treatments / Results  Labs (all labs ordered are listed, but only abnormal results are displayed) Labs Reviewed - No data to display  EKG   Radiology No results found.  Procedures Procedures (including critical care time)  Medications Ordered in UC Medications  ketorolac (TORADOL) injection 30 mg (has no administration in time range)    Initial Impression / Assessment and Plan / UC Course  I have reviewed the triage vital signs and the nursing notes.  Pertinent labs & imaging results that were available during my care of the patient were reviewed by me and considered in my medical decision making (see chart for details).  Vitals and triage reviewed, patient is hemodynamically stable.  Indurated, tender abscess to the left axilla.  Previous scars and tracking as well.  Afebrile.  Suspect abscess from HS flare.  Will treat with doxycycline and topical clinda.  Return to clinic in 72 hours to see if abscess is ready for incision and drainage at that time.  Pain management with NSAIDs and warm compresses discussed.  Plan of care, follow-up care return precautions given, no questions at this time.     Final Clinical Impressions(s) / UC Diagnoses   Final diagnoses:  Abscess of left axilla     Discharge Instructions      You have an abscess in your left axilla, most likely due to your Hidradenitis suppurativa.  Please do warm compresses for 10 to 15 minutes 3 times daily.  You can take 800 mg of ibuprofen every 8 hours starting tomorrow for any pain and inflammation.  Take  all antibiotics as prescribed and until finished.  Return to clinic in 72 hours for reassessment, as the area may be ready for incision and drainage.    ED Prescriptions     Medication Sig Dispense Auth. Provider   doxycycline (VIBRAMYCIN) 100 MG capsule Take 1 capsule (100 mg total) by mouth 2 (two) times daily. 20 capsule Rinaldo Ratel, [place] N, [place]   clindamycin (CLINDAGEL) 1 % gel Apply topically 2 (two) times daily. 30 g Audrick Lamoureaux, [place] N, [place]      PDMP not reviewed this encounter.   Remmi Armenteros, [place] N, [place] 03/10/23 [phone]

## 2023-03-10 NOTE — ED Triage Notes (Signed)
Pt c/o abscess under lt arm x3 days. C/o pain and redness. Denies drainage. States used warm compresses with no relief.

## 2023-03-31 ENCOUNTER — Encounter (HOSPITAL_COMMUNITY): Payer: Self-pay

## 2023-03-31 ENCOUNTER — Ambulatory Visit (HOSPITAL_COMMUNITY)
Admission: EM | Admit: 2023-03-31 | Discharge: 2023-03-31 | Disposition: A | Discharge status: Home / Self Care | Payer: Medicaid Other | Attending: Family Medicine | Admitting: Family Medicine

## 2023-03-31 DIAGNOSIS — L03112 Cellulitis of left axilla: Secondary | ICD-10-CM | POA: Diagnosis not present

## 2023-03-31 MED ORDER — KETOROLAC TROMETHAMINE 30 MG/ML IJ SOLN
INTRAMUSCULAR | Status: AC
  Filled 2023-03-31: qty 1

## 2023-03-31 MED ORDER — KETOROLAC TROMETHAMINE 10 MG PO TABS: 10.0000 mg | ORAL_TABLET | Freq: Four times a day (QID) | ORAL | 0 refills | Status: AC | PRN

## 2023-03-31 MED ORDER — KETOROLAC TROMETHAMINE 30 MG/ML IJ SOLN
30.0000 mg | Freq: Once | INTRAMUSCULAR | Status: AC
  Administered 2023-03-31: 30 mg via INTRAMUSCULAR

## 2023-03-31 MED ORDER — AMOXICILLIN-POT CLAVULANATE 875-125 MG PO TABS: 1.0000 | ORAL_TABLET | Freq: Two times a day (BID) | ORAL | 0 refills | Status: AC

## 2023-03-31 NOTE — ED Provider Notes (Signed)
 [facility]    CSN: [id number] Arrival date & time: 03/31/23  1058      History   Chief Complaint Chief Complaint  Patient presents with   Abscess    HPI Dana Morris is a 38 y.o. female.    Abscess Here for swelling and pain in her left axilla.  She was seen here December 13.  At that time it was not felt that she needed any drainage procedure and she was prescribed doxycycline .  She did improve taking that, and now 3 days ago she began having more pain and swelling in the same area.  No fever or chills  NKDA  Last menstrual cycle was December 28    Past Medical History:  Diagnosis Date   Abscess    Asthma    Bronchitis     Patient Active Problem List   Diagnosis Date Noted   Ruptured ectopic pregnancy 05/01/2021   Hidradenitis suppurativa 08/05/2018    Past Surgical History:  Procedure Laterality Date   IRRIGATION AND DEBRIDEMENT ABSCESS Right 08/06/2018   Procedure: IRRIGATION AND DEBRIDEMENT AXILLARY ABSCESS;  Surgeon: Signe Mitzie LABOR, MD;  Location: [facility];  Service: General;  Laterality: Right;   LAPAROSCOPIC UNILATERAL SALPINGECTOMY Right 05/01/2021   Procedure: LAPAROSCOPIC RIGHT SALPINGECTOMY WITH REMOVAL OF ECTOPIC PREGNANCY;  Surgeon: Cleatus Moccasin, MD;  Location: [facility] OR;  Service: Gynecology;  Laterality: Right;    OB History     Gravida  2   Para      Term      Preterm      AB  1   Living         SAB  1   IAB      Ectopic      Multiple      Live Births               Home Medications    Prior to Admission medications   Medication Sig Start Date End Date Taking? Authorizing Provider  amoxicillin -clavulanate (AUGMENTIN ) 875-125 MG tablet Take 1 tablet by mouth 2 (two) times daily for 7 days. 03/31/23 04/07/23 Yes Tuere Nwosu, Sharlet POUR, MD  clindamycin  (CLINDAGEL) 1 % gel Apply topically 2 (two) times daily. 03/10/23  Yes Garrison, [place]  N, FNP  ketorolac  (TORADOL ) 10 MG tablet Take 1 tablet (10 mg total) by  mouth every 6 (six) hours as needed (pain). 03/31/23  Yes Keilynn Marano, Sharlet POUR, MD    Family History Family History  Problem Relation Age of Onset   Hypertension Mother    Diabetes Maternal Uncle     Social History Social History   Tobacco Use   Smoking status: Former    Types: Cigarettes   Smokeless tobacco: Never  Vaping Use   Vaping status: Never Used  Substance Use Topics   Alcohol use: Not Currently   Drug use: Not Currently     Allergies   Patient has no known allergies.   Review of Systems Review of Systems   Physical Exam Triage Vital Signs ED Triage Vitals  Encounter Vitals Group     BP 03/31/23 1159 113/79     Systolic BP Percentile --      Diastolic BP Percentile --      Pulse Rate 03/31/23 1159 85     Resp 03/31/23 1159 18     Temp 03/31/23 1159 98.7 F (37.1 C)     Temp Source 03/31/23 1159 Oral     SpO2 03/31/23 1159 95 %  Weight 03/31/23 1156 240 lb (108.9 kg)     Height 03/31/23 1156 5' 7 (1.702 m)     Head Circumference --      Peak Flow --      Pain Score 03/31/23 1156 10     Pain Loc --      Pain Education --      Exclude from Growth Chart --    No data found.  Updated Vital Signs BP 113/79 (BP Location: Right Arm)   Pulse 85   Temp 98.7 F (37.1 C) (Oral)   Resp 18   Ht 5' 7 (1.702 m)   Wt 108.9 kg   LMP 03/25/2023 (Exact Date)   SpO2 95%   BMI 37.59 kg/m   Visual Acuity Right Eye Distance:   Left Eye Distance:   Bilateral Distance:    Right Eye Near:   Left Eye Near:    Bilateral Near:     Physical Exam Vitals reviewed.  Constitutional:      General: She is not in acute distress.    Appearance: She is not ill-appearing, toxic-appearing or diaphoretic.  Skin:    Coloration: Skin is not jaundiced or pale.     Comments: In her left axilla there is an area of induration that is irregular and pitted in 2 or 3 places.  I cannot discern any fluctuance where an abscess is forming in that drainage would definitely help  her today.  There is no drainage from the pitting areas.  There are some mild erythema and is very tender  Neurological:     Mental Status: She is alert.      UC Treatments / Results  Labs (all labs ordered are listed, but only abnormal results are displayed) Labs Reviewed - No data to display  EKG   Radiology No results found.  Procedures Procedures (including critical care time)  Medications Ordered in UC Medications  ketorolac  (TORADOL ) 30 MG/ML injection 30 mg (30 mg Intramuscular Given 03/31/23 1239)    Initial Impression / Assessment and Plan / UC Course  I have reviewed the triage vital signs and the nursing notes.  Pertinent labs & imaging results that were available during my care of the patient were reviewed by me and considered in my medical decision making (see chart for details).   Augmentin  is sent in to treat as an alternative.  Warm compresses are recommended.  I discussed with her why I did not think an drainage procedure was indicated today and that it would not benefit her since there is not a definite abscess formation at this time  She is given contact information for dermatology, and primary care provider appointment is set up Final Clinical Impressions(s) / UC Diagnoses   Final diagnoses:  Cellulitis of left axilla     Discharge Instructions      Take amoxicillin -clavulanate 875 mg--1 tab twice daily with food for 7 days  You have been given a shot of Toradol  30 mg today.  Ketorolac  10 mg tablets--take 1 tablet every 6 hours as needed for pain.  This is the same medicine that is in the shot we just gave you  Please continue to do warm compresses.  You can use the QR code/website at the back of the summary paperwork to schedule yourself a new patient appointment with primary care; he most likely will need to see primary care to be referred and authorized to go see a specialist with a dermatologist.     ED  Prescriptions     Medication Sig  Dispense Auth. Provider   amoxicillin -clavulanate (AUGMENTIN ) 875-125 MG tablet Take 1 tablet by mouth 2 (two) times daily for 7 days. 14 tablet Saidee Geremia K, MD   ketorolac  (TORADOL ) 10 MG tablet Take 1 tablet (10 mg total) by mouth every 6 (six) hours as needed (pain). 20 tablet Margurete Guaman K, MD      PDMP not reviewed this encounter.   Vonna Sharlet POUR, MD 03/31/23 2041

## 2023-03-31 NOTE — Discharge Instructions (Addendum)
 Take amoxicillin -clavulanate 875 mg--1 tab twice daily with food for 7 days  You have been given a shot of Toradol  30 mg today.  Ketorolac  10 mg tablets--take 1 tablet every 6 hours as needed for pain.  This is the same medicine that is in the shot we just gave you  Please continue to do warm compresses.  You can use the QR code/website at the back of the summary paperwork to schedule yourself a new patient appointment with primary care; he most likely will need to see primary care to be referred and authorized to go see a specialist with a dermatologist.

## 2023-03-31 NOTE — ED Notes (Signed)
 Primary Care Appointment made by this RN -- April 11 2023 @ 7:30 AM.

## 2023-03-31 NOTE — ED Triage Notes (Addendum)
 Pt presents with abscess under left axilla x 3 weeks. Seen here for same chief complaint on 12/13 and given antibiotics. Pt reports she took all of the antibiotic medication. Pt currently rates her left axilla pain a 10/10, pain increases with movement. No medications taken today. Prescribed Clindamycin  gel applied yesterday evening to area.

## 2023-04-03 ENCOUNTER — Telehealth: Payer: Self-pay

## 2023-04-03 NOTE — Telephone Encounter (Signed)
 Cancelled pt's new pt appt due to error in scheduling on MyChart. Provider had already reached her limit for new pts that day. I requested for pt to call the office back to r/s her new pt appt. Please schedule pt for appt.

## 2023-04-11 ENCOUNTER — Ambulatory Visit: Payer: Medicaid Other | Admitting: Internal Medicine

## 2023-10-18 ENCOUNTER — Other Ambulatory Visit: Payer: Self-pay

## 2023-10-18 ENCOUNTER — Emergency Department (HOSPITAL_COMMUNITY)
Admission: EM | Admit: 2023-10-18 | Discharge: 2023-10-18 | Disposition: A | Discharge status: Home / Self Care | Attending: Emergency Medicine | Admitting: Emergency Medicine

## 2023-10-18 ENCOUNTER — Encounter (HOSPITAL_COMMUNITY): Payer: Self-pay

## 2023-10-18 DIAGNOSIS — K047 Periapical abscess without sinus: Secondary | ICD-10-CM | POA: Insufficient documentation

## 2023-10-18 DIAGNOSIS — K0889 Other specified disorders of teeth and supporting structures: Secondary | ICD-10-CM | POA: Diagnosis present

## 2023-10-18 LAB — COMPREHENSIVE METABOLIC PANEL WITH GFR
ALT: 24 U/L (ref 0–44)
AST: 24 U/L (ref 15–41)
Albumin: 4.3 g/dL (ref 3.5–5.0)
Alkaline Phosphatase: 55 U/L (ref 38–126)
Anion gap: 7 (ref 5–15)
BUN: 11 mg/dL (ref 6–20)
CO2: 25 mmol/L (ref 22–32)
Calcium: 9.4 mg/dL (ref 8.9–10.3)
Chloride: 105 mmol/L (ref 98–111)
Creatinine, Ser: 0.67 mg/dL (ref 0.44–1.00)
GFR, Estimated: >60 mL/min (ref >60)
Glucose, Bld: 91 mg/dL (ref 70–99)
Potassium: 3.8 mmol/L (ref 3.5–5.1)
Sodium: 137 mmol/L (ref 135–145)
Total Bilirubin: 0.5 mg/dL (ref 0.0–1.2)
Total Protein: 8.2 g/dL — ABNORMAL HIGH (ref 6.5–8.1)

## 2023-10-18 LAB — CBC WITH DIFFERENTIAL/PLATELET
Abs Immature Granulocytes: 0.01 K/uL (ref 0.00–0.07)
Basophils Absolute: 0 K/uL (ref 0.0–0.1)
Basophils Relative: 0 %
Eosinophils Absolute: 0.1 K/uL (ref 0.0–0.5)
Eosinophils Relative: 2 %
HCT: 41.4 % (ref 36.0–46.0)
Hemoglobin: 13.9 g/dL (ref 12.0–15.0)
Immature Granulocytes: 0 %
Lymphocytes Relative: 34 %
Lymphs Abs: 2.1 K/uL (ref 0.7–4.0)
MCH: 29.3 pg (ref 26.0–34.0)
MCHC: 33.6 g/dL (ref 30.0–36.0)
MCV: 87.2 fL (ref 80.0–100.0)
Monocytes Absolute: 0.4 K/uL (ref 0.1–1.0)
Monocytes Relative: 6 %
Neutro Abs: 3.6 K/uL (ref 1.7–7.7)
Neutrophils Relative %: 58 %
Platelets: 321 K/uL (ref 150–400)
RBC: 4.75 MIL/uL (ref 3.87–5.11)
RDW: 13.2 % (ref 11.5–15.5)
WBC: 6.2 K/uL (ref 4.0–10.5)
nRBC: 0 % (ref 0.0–0.2)

## 2023-10-18 MED ORDER — AMOXICILLIN-POT CLAVULANATE 875-125 MG PO TABS
1.0000 | ORAL_TABLET | Freq: Once | ORAL | Status: AC
Start: 2023-10-18 — End: 2023-10-18
  Administered 2023-10-18: 1 via ORAL
  Filled 2023-10-18: qty 1

## 2023-10-18 MED ORDER — HYDROCODONE-ACETAMINOPHEN 5-325 MG PO TABS: 1.0000 | ORAL_TABLET | Freq: Four times a day (QID) | ORAL | 0 refills | Status: AC | PRN

## 2023-10-18 MED ORDER — ONDANSETRON 4 MG PO TBDP
4.0000 mg | ORAL_TABLET | Freq: Once | ORAL | Status: AC
  Administered 2023-10-18: 4 mg via ORAL
  Filled 2023-10-18: qty 1

## 2023-10-18 MED ORDER — HYDROCODONE-ACETAMINOPHEN 5-325 MG PO TABS
2.0000 | ORAL_TABLET | Freq: Once | ORAL | Status: AC
Start: 2023-10-18 — End: 2023-10-18
  Administered 2023-10-18: 2 via ORAL
  Filled 2023-10-18: qty 2

## 2023-10-18 MED ORDER — NAPROXEN 375 MG PO TABS: 375.0000 mg | ORAL_TABLET | Freq: Two times a day (BID) | ORAL | 0 refills | Status: AC

## 2023-10-18 MED ORDER — AMOXICILLIN-POT CLAVULANATE 875-125 MG PO TABS
1.0000 | ORAL_TABLET | Freq: Two times a day (BID) | ORAL | 0 refills | Status: AC
Start: 2023-10-18 — End: ?

## 2023-10-18 NOTE — Discharge Instructions (Addendum)
You have been seen by your caregiver because of dental pain.  SEEK MEDICAL ATTENTION IF: The exam and treatment you received today has been provided on an emergency basis only. This is not a substitute for complete medical or dental care. If your problem worsens or new symptoms (problems) appear, and you are unable to arrange prompt follow-up care with your dentist, call or return to this location. CALL YOUR DENTIST OR RETURN IMMEDIATELY IF you develop a fever, rash, difficulty breathing or swallowing, neck or facial swelling, or other potentially serious concerns.  

## 2023-10-18 NOTE — ED Triage Notes (Signed)
 Pt BIB EMS from home with reports of dental pain left upper and lower x 1 week. Pt has an appt scheduled for August.

## 2023-10-18 NOTE — ED Provider Notes (Signed)
 [place] EMERGENCY DEPARTMENT AT [facility] Provider Note   CSN: [id number] Arrival date & time: 10/18/23  1502     Patient presents with: Dental Pain   Dana Morris is a 38 y.o. female who presents emergency department chief complaint of dental pain.  Patient has a broken molar on the left lower side.  She states that she developed pain a couple weeks ago went to an urgent care was prescribed anti-inflammatory medications and amoxicillin  3 times daily.  She took the medications and things improved however she stopped taking the antibiotics about 3 days ago had return of pain which is worse with chewing cold or hot on the left side and she has noticed increased swelling along her mandible and face.  Hurts to move her jaw.  Refers to her ear.  She denies fever or chills difficulty swallowing.  She rates her pain as severe and was tearful earlier.    Dental Pain      Prior to Admission medications   Medication Sig Start Date End Date Taking? Authorizing Provider  clindamycin  (CLINDAGEL) 1 % gel Apply topically 2 (two) times daily. 03/10/23   Dreama, [place]  N, FNP  ketorolac  (TORADOL ) 10 MG tablet Take 1 tablet (10 mg total) by mouth every 6 (six) hours as needed (pain). 03/31/23   Banister, Pamela K, MD    Allergies: Patient has no known allergies.    Review of Systems  Updated Vital Signs BP (!) 129/98 (BP Location: Right Arm)   Pulse 89   Temp 98.8 F (37.1 C) (Oral)   Resp 17   Ht 5' 6 (1.676 m)   Wt 108.9 kg   SpO2 98%   BMI 38.74 kg/m   Physical Exam Vitals and nursing note reviewed.  Constitutional:      General: She is not in acute distress.    Appearance: She is well-developed. She is not diaphoretic.  HENT:     Head: Normocephalic and atraumatic.     Right Ear: External ear normal.     Left Ear: External ear normal.     Nose: Nose normal.     Mouth/Throat:     Mouth: Mucous membranes are moist.     Comments: Broken posterior half of  the left lower molar.  There is dental decay and surrounding erythema without obvious abscess.  No hypoglossal swelling, no extension into the posterior oropharynx, normal phonation Eyes:     General: No scleral icterus.    Conjunctiva/sclera: Conjunctivae normal.  Cardiovascular:     Rate and Rhythm: Normal rate and regular rhythm.     Heart sounds: Normal heart sounds. No murmur heard.    No friction rub. No gallop.  Pulmonary:     Effort: Pulmonary effort is normal. No respiratory distress.     Breath sounds: Normal breath sounds.  Abdominal:     General: Bowel sounds are normal. There is no distension.     Palpations: Abdomen is soft. There is no mass.     Tenderness: There is no abdominal tenderness. There is no guarding.  Musculoskeletal:     Cervical back: Normal range of motion.  Skin:    General: Skin is warm and dry.  Neurological:     Mental Status: She is alert and oriented to person, place, and time.  Psychiatric:        Behavior: Behavior normal.     (all labs ordered are listed, but only abnormal results are displayed) Labs Reviewed  COMPREHENSIVE  METABOLIC PANEL WITH GFR - Abnormal; Notable for the following components:      Result Value   Total Protein 8.2 (*)    All other components within normal limits  CBC WITH DIFFERENTIAL/PLATELET    EKG: None  Radiology: No results found.   Procedures   Medications Ordered in the ED - No data to display                                  Medical Decision Making Amount and/or Complexity of Data Reviewed Labs: ordered.   Patient here with broken tooth, severe pain, dental infection, she has some mild trismus.  Will discharge the patient with pain medications, Augmentin , anti-inflammatories and close dental follow-up.  She states she has an established appointment in early August.  No evidence of Ludwig's angina.     Final diagnoses:  None    ED Discharge Orders     None          Cambria Osten,  Jalal Rauch, PA-C 10/18/23 2032    Patsey Lot, MD 10/18/23 2300
